# Patient Record
Sex: Female | Born: 1950
Health system: Southern US, Community
[De-identification: ages and names within clinical notes are randomized; demographics above are authoritative.]

## PROBLEM LIST (undated history)

## (undated) DIAGNOSIS — M25562 Pain in left knee: Secondary | ICD-10-CM

## (undated) DIAGNOSIS — E785 Hyperlipidemia, unspecified: Secondary | ICD-10-CM

## (undated) DIAGNOSIS — Z9889 Other specified postprocedural states: Secondary | ICD-10-CM

## (undated) DIAGNOSIS — T8859XA Other complications of anesthesia, initial encounter: Secondary | ICD-10-CM

## (undated) DIAGNOSIS — M1990 Unspecified osteoarthritis, unspecified site: Secondary | ICD-10-CM

## (undated) DIAGNOSIS — I10 Essential (primary) hypertension: Secondary | ICD-10-CM

## (undated) DIAGNOSIS — T4145XA Adverse effect of unspecified anesthetic, initial encounter: Secondary | ICD-10-CM

## (undated) DIAGNOSIS — R112 Nausea with vomiting, unspecified: Secondary | ICD-10-CM

## (undated) DIAGNOSIS — E039 Hypothyroidism, unspecified: Secondary | ICD-10-CM

## (undated) DIAGNOSIS — R12 Heartburn: Secondary | ICD-10-CM

## (undated) HISTORY — DX: Essential (primary) hypertension: I10

## (undated) HISTORY — PX: ABDOMINAL HYSTERECTOMY: SHX81

## (undated) HISTORY — PX: ABDOMINAL HYSTERECTOMY: SUR658

## (undated) HISTORY — DX: Heartburn: R12

## (undated) HISTORY — DX: Unspecified osteoarthritis, unspecified site: M19.90

## (undated) HISTORY — PX: OTHER SURGICAL HISTORY: SHX169

## (undated) HISTORY — PX: REPLACEMENT TOTAL KNEE BILATERAL: SUR1225

## (undated) HISTORY — DX: Hyperlipidemia, unspecified: E78.5

## (undated) HISTORY — DX: Hypothyroidism, unspecified: E03.9

## (undated) HISTORY — DX: Pain in left knee: M25.562

---
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
## 1971-07-11 HISTORY — PX: BREAST BIOPSY: SHX20

## 1978-07-10 HISTORY — PX: CHOLECYSTECTOMY: SHX55

## 1999-07-11 DIAGNOSIS — E039 Hypothyroidism, unspecified: Secondary | ICD-10-CM | POA: Insufficient documentation

## 2000-07-10 HISTORY — PX: NASAL SINUS SURGERY: SHX719

## 2000-07-10 HISTORY — PX: THYROID SURGERY: SHX805

## 2005-07-10 HISTORY — PX: ULNAR NERVE TRANSPOSITION: SHX2595

## 2010-07-10 HISTORY — PX: INCONTINENCE SURGERY: SHX676

## 2010-07-10 HISTORY — PX: REPLACEMENT TOTAL KNEE: SUR1224

## 2011-03-11 DIAGNOSIS — Z96652 Presence of left artificial knee joint: Secondary | ICD-10-CM | POA: Insufficient documentation

## 2016-04-26 ENCOUNTER — Other Ambulatory Visit: Payer: Self-pay | Admitting: Family Medicine

## 2016-04-26 DIAGNOSIS — Z1231 Encounter for screening mammogram for malignant neoplasm of breast: Secondary | ICD-10-CM

## 2016-05-18 DIAGNOSIS — G8929 Other chronic pain: Secondary | ICD-10-CM | POA: Diagnosis not present

## 2016-05-18 DIAGNOSIS — M25562 Pain in left knee: Secondary | ICD-10-CM | POA: Diagnosis not present

## 2016-05-22 DIAGNOSIS — M25562 Pain in left knee: Secondary | ICD-10-CM | POA: Diagnosis not present

## 2016-05-22 DIAGNOSIS — G8929 Other chronic pain: Secondary | ICD-10-CM | POA: Diagnosis not present

## 2016-05-26 ENCOUNTER — Encounter (HOSPITAL_COMMUNITY): Payer: Self-pay

## 2016-05-26 ENCOUNTER — Ambulatory Visit
Admission: RE | Admit: 2016-05-26 | Discharge: 2016-05-26 | Disposition: A | Discharge status: Home / Self Care | Payer: Medicare Other | Source: Ambulatory Visit | Attending: Family Medicine | Admitting: Family Medicine

## 2016-05-26 DIAGNOSIS — M25562 Pain in left knee: Secondary | ICD-10-CM | POA: Diagnosis not present

## 2016-05-26 DIAGNOSIS — Z1231 Encounter for screening mammogram for malignant neoplasm of breast: Secondary | ICD-10-CM

## 2016-05-26 DIAGNOSIS — G8929 Other chronic pain: Secondary | ICD-10-CM | POA: Diagnosis not present

## 2016-05-29 DIAGNOSIS — R300 Dysuria: Secondary | ICD-10-CM | POA: Diagnosis not present

## 2016-05-30 DIAGNOSIS — G8929 Other chronic pain: Secondary | ICD-10-CM | POA: Diagnosis not present

## 2016-05-30 DIAGNOSIS — M25562 Pain in left knee: Secondary | ICD-10-CM | POA: Diagnosis not present

## 2016-06-05 DIAGNOSIS — G8929 Other chronic pain: Secondary | ICD-10-CM | POA: Diagnosis not present

## 2016-06-05 DIAGNOSIS — M25562 Pain in left knee: Secondary | ICD-10-CM | POA: Diagnosis not present

## 2016-06-06 ENCOUNTER — Other Ambulatory Visit: Payer: Self-pay | Admitting: *Deleted

## 2016-06-06 ENCOUNTER — Inpatient Hospital Stay
Admission: RE | Admit: 2016-06-06 | Discharge: 2016-06-06 | Disposition: A | Discharge status: Home / Self Care | Payer: Self-pay | Source: Ambulatory Visit | Attending: *Deleted | Admitting: *Deleted

## 2016-06-06 DIAGNOSIS — Z9289 Personal history of other medical treatment: Secondary | ICD-10-CM

## 2016-06-08 DIAGNOSIS — M25562 Pain in left knee: Secondary | ICD-10-CM | POA: Diagnosis not present

## 2016-06-08 DIAGNOSIS — G8929 Other chronic pain: Secondary | ICD-10-CM | POA: Diagnosis not present

## 2016-06-09 HISTORY — PX: OTHER SURGICAL HISTORY: SHX169

## 2016-06-13 DIAGNOSIS — G8929 Other chronic pain: Secondary | ICD-10-CM | POA: Diagnosis not present

## 2016-06-13 DIAGNOSIS — M25562 Pain in left knee: Secondary | ICD-10-CM | POA: Diagnosis not present

## 2016-06-15 ENCOUNTER — Ambulatory Visit (INDEPENDENT_AMBULATORY_CARE_PROVIDER_SITE_OTHER): Payer: Medicare Other | Admitting: Vascular Surgery

## 2016-06-15 ENCOUNTER — Encounter (INDEPENDENT_AMBULATORY_CARE_PROVIDER_SITE_OTHER): Payer: Self-pay | Admitting: Vascular Surgery

## 2016-06-15 DIAGNOSIS — I83813 Varicose veins of bilateral lower extremities with pain: Secondary | ICD-10-CM

## 2016-06-15 DIAGNOSIS — M79604 Pain in right leg: Secondary | ICD-10-CM

## 2016-06-15 DIAGNOSIS — M79605 Pain in left leg: Secondary | ICD-10-CM

## 2016-06-15 DIAGNOSIS — I872 Venous insufficiency (chronic) (peripheral): Secondary | ICD-10-CM | POA: Insufficient documentation

## 2016-06-15 DIAGNOSIS — I83811 Varicose veins of right lower extremities with pain: Secondary | ICD-10-CM

## 2016-06-15 DIAGNOSIS — M79609 Pain in unspecified limb: Secondary | ICD-10-CM | POA: Insufficient documentation

## 2016-06-15 NOTE — Progress Notes (Signed)
    MRN : [id number]  Holly Lam is a 65 y.o. (02/18/1951) female who presents with chief complaint of  Chief Complaint  Patient presents with  . Varicose Veins    Right leg Laser ablation Great and small saph  .    The patient's right lower extremity was sterilely prepped and draped.  The ultrasound machine was used to visualize the great saphenous vein throughout its course.  A segment below the knee was selected for access.  The saphenous vein was accessed without difficulty using ultrasound guidance with a micropuncture needle.   An 0.018  wire was placed beyond the saphenofemoral junction through the sheath and the microneedle was removed.  The 65 cm sheath was then placed over the wire and the wire and dilator were removed.  The laser fiber was placed through the sheath and its tip was placed approximately 2 cm below the saphenofemoral junction.  Tumescent anesthesia was then created with a dilute lidocaine solution.  Laser energy was then delivered with constant withdrawal of the sheath and laser fiber.  Approximately 1952 Joules of energy were delivered over a length of 49 cm.    The small saphenus ven was then evaluated with the ultrasound and found to be approximately 1 mm in diameter.  There is a large 8-10 mm gastroc perforator that feeds into the posterior varicosities identified by ultrasound.  This would best be treated with ultrasound guided sclerotherapy.  Sterile dressings were placed.  The patient tolerated the procedure well without complications.

## 2016-06-19 ENCOUNTER — Other Ambulatory Visit (INDEPENDENT_AMBULATORY_CARE_PROVIDER_SITE_OTHER): Payer: Self-pay | Admitting: Vascular Surgery

## 2016-06-19 ENCOUNTER — Ambulatory Visit (INDEPENDENT_AMBULATORY_CARE_PROVIDER_SITE_OTHER): Payer: Medicare Other

## 2016-06-19 DIAGNOSIS — I83813 Varicose veins of bilateral lower extremities with pain: Secondary | ICD-10-CM | POA: Diagnosis not present

## 2016-06-20 DIAGNOSIS — E890 Postprocedural hypothyroidism: Secondary | ICD-10-CM | POA: Diagnosis not present

## 2016-06-22 DIAGNOSIS — M25562 Pain in left knee: Secondary | ICD-10-CM | POA: Diagnosis not present

## 2016-06-22 DIAGNOSIS — G8929 Other chronic pain: Secondary | ICD-10-CM | POA: Diagnosis not present

## 2016-06-26 DIAGNOSIS — G8929 Other chronic pain: Secondary | ICD-10-CM | POA: Diagnosis not present

## 2016-06-26 DIAGNOSIS — M25562 Pain in left knee: Secondary | ICD-10-CM | POA: Diagnosis not present

## 2016-06-27 DIAGNOSIS — E890 Postprocedural hypothyroidism: Secondary | ICD-10-CM | POA: Diagnosis not present

## 2016-06-27 DIAGNOSIS — E041 Nontoxic single thyroid nodule: Secondary | ICD-10-CM | POA: Diagnosis not present

## 2016-06-27 DIAGNOSIS — E6609 Other obesity due to excess calories: Secondary | ICD-10-CM | POA: Diagnosis not present

## 2016-06-28 DIAGNOSIS — R300 Dysuria: Secondary | ICD-10-CM | POA: Diagnosis not present

## 2016-07-05 DIAGNOSIS — G8929 Other chronic pain: Secondary | ICD-10-CM | POA: Diagnosis not present

## 2016-07-05 DIAGNOSIS — M25562 Pain in left knee: Secondary | ICD-10-CM | POA: Diagnosis not present

## 2016-07-12 DIAGNOSIS — M25562 Pain in left knee: Secondary | ICD-10-CM | POA: Diagnosis not present

## 2016-07-12 DIAGNOSIS — G8929 Other chronic pain: Secondary | ICD-10-CM | POA: Diagnosis not present

## 2016-07-13 ENCOUNTER — Encounter (INDEPENDENT_AMBULATORY_CARE_PROVIDER_SITE_OTHER): Payer: Self-pay | Admitting: Vascular Surgery

## 2016-07-13 ENCOUNTER — Ambulatory Visit (INDEPENDENT_AMBULATORY_CARE_PROVIDER_SITE_OTHER): Payer: Medicare Other | Admitting: Vascular Surgery

## 2016-07-13 VITALS — BP 123/73 | HR 68 | Resp 16 | Ht 65.0 in | Wt 236.0 lb

## 2016-07-13 DIAGNOSIS — M79605 Pain in left leg: Secondary | ICD-10-CM | POA: Diagnosis not present

## 2016-07-13 DIAGNOSIS — M79604 Pain in right leg: Secondary | ICD-10-CM

## 2016-07-13 DIAGNOSIS — I872 Venous insufficiency (chronic) (peripheral): Secondary | ICD-10-CM | POA: Diagnosis not present

## 2016-07-13 DIAGNOSIS — I83813 Varicose veins of bilateral lower extremities with pain: Secondary | ICD-10-CM | POA: Diagnosis not present

## 2016-07-13 NOTE — Progress Notes (Signed)
MRN : [id number]  Holly Lam is a 66 y.o. (09/22/1950) female who presents with chief complaint of  Chief Complaint  Patient presents with  . Routine Post Op    Post Laser Right Leg  .  History of Present Illness: The patient returns to the office for followup status post laser ablation of the right saphenous vein on 06/15/2016.  Follow-up ultrasound dated 06/19/2016 demonstrates successful ablation of the right great saphenous vein.The patient notes significant improvement in the lower extremity pain but not resolution of the symptoms. The patient notes multiple residual varicosities bilaterally which continued to hurt with dependent positions and remained tender to palpation. The patient's swelling is minimally from preoperative status. The patient continues to wear graduated compression stockings on a daily basis but these are not eliminating the pain and discomfort. The patient continues to use over-the-counter anti-inflammatory medications to treat the pain and related symptoms but this has not given the patient relief. The patient notes the pain in the lower extremities is causing problems with daily exercise, problems at work and even with household activities such as preparing meals and doing dishes.  The patient is otherwise done well and there have been no complications related to the laser procedure or interval changes in the patient's overall   Post laser ultrasound shows successful ablation of the right great saphenous vein  Current Meds  Medication Sig  . acetaminophen (TYLENOL) 500 MG tablet Take by mouth.  . ALPRAZolam (XANAX) 0.5 MG tablet   . celecoxib (CELEBREX) 200 MG capsule   . cetirizine (ZYRTEC) 10 MG tablet Take by mouth.  . docusate sodium (COLACE) 100 MG capsule Take by mouth.  . fexofenadine (ALLEGRA) 180 MG tablet Take by mouth.  . lansoprazole (PREVACID) 30 MG capsule   . levothyroxine (SYNTHROID, LEVOTHROID) 50 MCG tablet Take by mouth.  . olmesartan  (BENICAR) 40 MG tablet Take by mouth.  . olmesartan (BENICAR) 40 MG tablet   . sulfamethoxazole-trimethoprim (BACTRIM DS,SEPTRA DS) 800-160 MG tablet   . SYNTHROID 50 MCG tablet   . venlafaxine XR (EFFEXOR-XR) 150 MG 24 hr capsule Take by mouth.  Marland Kitchen. VICTOZA 18 MG/3ML SOPN     Past Medical History:  Diagnosis Date  . Diabetes mellitus without complication (HCC)   . Hyperlipidemia   . Hypertension     Past Surgical History:  Procedure Laterality Date  . BREAST BIOPSY Left    neg    Social History Social History  Substance Use Topics  . Smoking status: Never Smoker  . Smokeless tobacco: Never Used  . Alcohol use No    Family History Family History  Problem Relation Age of Onset  . Breast cancer Neg Hx   No family history of bleeding/clotting disorders, porphyria or autoimmune disease   Allergies  Allergen Reactions  . Levofloxacin Nausea Only  . Moxifloxacin Nausea Only  . Prednisone     Other reaction(s): Other (See Comments) Sensitivity     REVIEW OF SYSTEMS (Negative unless checked)  Constitutional: [] Weight loss  [] Fever  [] Chills Cardiac: [] Chest pain   [] Chest pressure   [] Palpitations   [] Shortness of breath when laying flat   [] Shortness of breath with exertion. Vascular:  [] Pain in legs with walking   [x] Pain in legs with standing  [] History of DVT   [] Phlebitis   [x] Swelling in legs   [x] Varicose veins   [] Non-healing ulcers Pulmonary:   [] Uses home oxygen   [] Productive cough   [] Hemoptysis   [] Wheeze  [] COPD   []   Asthma Neurologic:  [] Dizziness   [] Seizures   [] History of stroke   [] History of TIA  [] Aphasia   [] Vissual changes   [] Weakness or numbness in arm   [] Weakness or numbness in leg Musculoskeletal:   [] Joint swelling   [] Joint pain   [] Low back pain Hematologic:  [] Easy bruising  [] Easy bleeding   [] Hypercoagulable state   [] Anemic Gastrointestinal:  [] Diarrhea   [] Vomiting  [] Gastroesophageal reflux/heartburn   [] Difficulty  swallowing. Genitourinary:  [] Chronic kidney disease   [] Difficult urination  [] Frequent urination   [] Blood in urine Skin:  [] Rashes   [] Ulcers  Psychological:  [] History of anxiety   []  History of major depression.  Physical Examination  Vitals:   07/13/16 1413  BP: 123/73  Pulse: 68  Resp: 16  Weight: 107 kg (236 lb)  Height: 5\' 5"  (1.651 m)   Body mass index is 39.27 kg/m. Gen: WD/WN, NAD Head: [place]/AT, No temporalis wasting.  Ear/Nose/Throat: Hearing grossly intact, nares w/o erythema or drainage, poor dentition Eyes: PER, EOMI, sclera nonicteric.  Neck: Supple, no masses.  No bruit or JVD.  Pulmonary:  Good air movement, clear to auscultation bilaterally, no use of accessory muscles.  Cardiac: RRR, normal S1, S2, no Murmurs. Vascular: multiple large residual varicose veins right lower extremity particularly in the medial and posterior calf many are greater than 8 mm in diameter and tender to palpation. There is mild venous dermatitis noted bilaterally in the ankle areas. There is 2+ edema. Vessel Right Left  Radial Palpable Palpable  Ulnar Palpable Palpable  Brachial Palpable Palpable  Carotid Palpable Palpable  Femoral Palpable Palpable  Popliteal Palpable Palpable  PT Palpable Palpable  DP Palpable Palpable   Gastrointestinal: soft, non-distended. No guarding/no peritoneal signs.  Musculoskeletal: M/S 5/5 throughout.  No deformity or atrophy.  Neurologic: CN 2-12 intact. Pain and light touch intact in extremities.  Symmetrical.  Speech is fluent. Motor exam as listed above. Psychiatric: Judgment intact, Mood & affect appropriate for pt's clinical situation. Dermatologic: No rashes or ulcers noted.  No changes consistent with cellulitis. Lymph : No Cervical lymphadenopathy, no lichenification or skin changes of chronic lymphedema.  CBC No results found for: WBC, HGB, HCT, MCV, PLT  BMET No results found for: NA, K, CL, CO2, GLUCOSE, BUN, CREATININE, CALCIUM,  GFRNONAA, GFRAA CrCl cannot be calculated (No order found.).  COAG No results found for: INR, PROTIME  Radiology No results found.  Assessment/Plan 1. Varicose veins of both lower extremities with pain Recommend:  The patient has had successful ablation of the previously incompetent saphenous venous system but still has persistent symptoms of pain and swelling that are having a negative impact on daily life and daily activities.  Patient should undergo injection sclerotherapy of the right lower extremity to treat the symptomatic residual varicosities.  The risks, benefits and alternative therapies were reviewed in detail with the patient.  All questions were answered.  The patient agrees to proceed with sclerotherapy at their convenience.  The patient will continue wearing the graduated compression stockings and using the over-the-counter pain medications to treat her symptoms.    2. Chronic venous insufficiency See plan #1; continue graduated compression on a daily basis  3. Pain in both lower extremities See plan #1   Levora Dredge, MD  07/13/2016 3:23 PM

## 2016-07-20 DIAGNOSIS — M25562 Pain in left knee: Secondary | ICD-10-CM | POA: Diagnosis not present

## 2016-07-20 DIAGNOSIS — G8929 Other chronic pain: Secondary | ICD-10-CM | POA: Diagnosis not present

## 2016-07-28 DIAGNOSIS — R300 Dysuria: Secondary | ICD-10-CM | POA: Diagnosis not present

## 2016-08-03 ENCOUNTER — Ambulatory Visit (INDEPENDENT_AMBULATORY_CARE_PROVIDER_SITE_OTHER): Payer: Medicare Other | Admitting: Vascular Surgery

## 2016-08-03 ENCOUNTER — Encounter (INDEPENDENT_AMBULATORY_CARE_PROVIDER_SITE_OTHER): Payer: Self-pay | Admitting: Vascular Surgery

## 2016-08-03 VITALS — BP 134/78 | HR 69 | Resp 16 | Ht 65.0 in | Wt 238.0 lb

## 2016-08-03 DIAGNOSIS — I872 Venous insufficiency (chronic) (peripheral): Secondary | ICD-10-CM

## 2016-08-03 DIAGNOSIS — I83811 Varicose veins of right lower extremities with pain: Secondary | ICD-10-CM

## 2016-08-03 DIAGNOSIS — I83813 Varicose veins of bilateral lower extremities with pain: Secondary | ICD-10-CM

## 2016-08-06 NOTE — Progress Notes (Signed)
    MRN : [id number]  Holly Lam is a 66 y.o. (08/12/1950) female who presents with chief complaint of  Chief Complaint  Patient presents with  . Varicose Veins    Right leg sclerotherapy  .   Procedure:  Sclerotherapy using hypertonic saline mixed with 1% Lidocaine was performed on lower extremities bilateral.  Compression wraps were placed.  The patient tolerated the procedure well.  Plan:  Follow up as arranged

## 2016-08-15 DIAGNOSIS — R300 Dysuria: Secondary | ICD-10-CM | POA: Diagnosis not present

## 2016-08-23 NOTE — Progress Notes (Signed)
08/24/2016 9:14 AM   Holly Lam 1951-07-08 [id number]  Referring provider: Maryland Pink, MD [address] [facility] [place], [place] [address]  Chief Complaint  Patient presents with  . New Patient (Initial Visit)    recurrent uti referred by Dr. Kary Kos    HPI: Patient is a 66 -year-old Caucasian female who is referred to [place] by, Dr Kary Kos, for recurrent urinary tract infections.  Patient states that she has had four urinary tract infections over the last year.  Her symptoms with a urinary tract infection consist of unusual odor, dysuria, urgency, "feels all puckered up", incontinence, nocturia and frequency.   She denies gross hematuria, suprapubic pain, back pain, abdominal pain or flank pain.  She has not had any recent fevers, chills, nausea or vomiting.   She had a cystocele and rectocele repair with a bladder sling in 2012 was performed in [place] by Dr. Lauretta Grill.    Reviewing her records,  she has had two documented UTI's for E.coli.   She had one instance of AMH with 0-3 RBC's with a negative culture in 03/2016.    She is sexually active.  It is painful to have sex.  She states that her vagina feels raw during sex.  She has not noted a correlation with her urinary tract infections and sexual intercourse.    She is post menopausal.   She does not engage in anal sex.   She is voiding before and after sex.  She admits to constipation.    She does engage in good perineal hygiene. She does not take tub baths.  She does not have incontinence.    She is not having pain with bladder filling.  She has not had any recent imaging studies.    She is not drinking a lot of water daily.   She does drink coffee and tea.     Today, she feels thick and heavy in the pelvis area.  She feels like when she did when she was about to start her periods.  Her UA today was unremarkable.    PMH: Past Medical History:  Diagnosis Date  . Arthritis   . Diabetes mellitus without  complication ([place])   . Heartburn   . Hyperlipidemia   . Hypertension   . Hypothyroidism     Surgical History: Past Surgical History:  Procedure Laterality Date  . ABDOMINAL HYSTERECTOMY    . BREAST BIOPSY Left    neg  . CHOLECYSTECTOMY  1980  . INCONTINENCE SURGERY  2012   also cystocele and rectocele  . leg vein surgery Right 06/2016  . NASAL SINUS SURGERY  2002  . REPLACEMENT TOTAL KNEE Left 2012  . THYROID SURGERY  2002  . ULNAR NERVE TRANSPOSITION Left 2007    Home Medications:  Allergies as of 08/24/2016      Reactions   Levofloxacin Nausea Only   Moxifloxacin Nausea Only   Prednisone    Other reaction(s): Other (See Comments) Sensitivity      Medication List       Accurate as of 08/24/16  9:14 AM. Always use your most recent med list.          acetaminophen 500 MG tablet Commonly known as:  TYLENOL Take by mouth.   ALPRAZolam 0.5 MG tablet Commonly known as:  XANAX   celecoxib 200 MG capsule Commonly known as:  CELEBREX   cetirizine 10 MG tablet Commonly known as:  ZYRTEC Take by mouth.   docusate  sodium 100 MG capsule Commonly known as:  COLACE Take by mouth.   fexofenadine 180 MG tablet Commonly known as:  ALLEGRA Take by mouth.   lansoprazole 30 MG capsule Commonly known as:  PREVACID   levothyroxine 50 MCG tablet Commonly known as:  SYNTHROID, LEVOTHROID Take by mouth.   SYNTHROID 50 MCG tablet Generic drug:  levothyroxine   olmesartan 40 MG tablet Commonly known as:  BENICAR Take by mouth.   olmesartan 40 MG tablet Commonly known as:  BENICAR   sulfamethoxazole-trimethoprim 800-160 MG tablet Commonly known as:  BACTRIM DS,SEPTRA DS   venlafaxine XR 150 MG 24 hr capsule Commonly known as:  EFFEXOR-XR Take by mouth.   VICTOZA 18 MG/3ML Sopn Generic drug:  liraglutide       Allergies:  Allergies  Allergen Reactions  . Levofloxacin Nausea Only  . Moxifloxacin Nausea Only  . Prednisone     Other reaction(s): Other  (See Comments) Sensitivity    Family History: Family History  Problem Relation Age of Onset  . Prostate cancer Brother   . Breast cancer Neg Hx   . Kidney cancer Neg Hx   . Bladder Cancer Neg Hx     Social History:  reports that she has never smoked. She has never used smokeless tobacco. She reports that she drinks alcohol. She reports that she does not use drugs.  ROS: UROLOGY Frequent Urination?: Yes Hard to postpone urination?: Yes Burning/pain with urination?: Yes Get up at night to urinate?: Yes Leakage of urine?: Yes Urine stream starts and stops?: No Trouble starting stream?: No Do you have to strain to urinate?: No Blood in urine?: Yes Urinary tract infection?: Yes Sexually transmitted disease?: No Injury to kidneys or bladder?: No Painful intercourse?: Yes Weak stream?: No Currently pregnant?: No Vaginal bleeding?: No Last menstrual period?: n  Gastrointestinal Nausea?: No Vomiting?: No Indigestion/heartburn?: Yes Diarrhea?: No Constipation?: Yes  Constitutional Fever: No Night sweats?: No Weight loss?: No Fatigue?: Yes  Skin Skin rash/lesions?: No Itching?: No  Eyes Blurred vision?: No Double vision?: No  Ears/Nose/Throat Sore throat?: No Sinus problems?: No  Hematologic/Lymphatic Swollen glands?: No Easy bruising?: No  Cardiovascular Leg swelling?: No Chest pain?: No  Respiratory Cough?: No Shortness of breath?: No  Endocrine Excessive thirst?: No  Musculoskeletal Back pain?: Yes Joint pain?: Yes  Neurological Headaches?: No Dizziness?: No  Psychologic Depression?: No Anxiety?: No  Physical Exam: BP 137/80   Pulse 77   Ht _0  (1.651 m)   Wt 234 lb 14.4 oz (106.5 kg)   BMI 39.09 kg/m   Constitutional: Well nourished. Alert and oriented, No acute distress. HEENT: [place] AT, moist mucus membranes. Trachea midline, no masses. Cardiovascular: No clubbing, cyanosis, or edema. Respiratory: Normal respiratory effort,  no increased work of breathing. GI: Abdomen is soft, non tender, non distended, no abdominal masses. Liver and spleen not palpable.  No hernias appreciated.  Stool sample for occult testing is not indicated.   GU: No CVA tenderness.  No bladder fullness or masses.  Atrophic external genitalia, normal pubic hair distribution, no lesions.  Normal urethral meatus, no lesions, no prolapse, no discharge.   No urethral masses, tenderness and/or tenderness. No bladder fullness, tenderness or masses. Pale vagina mucosa, poor estrogen effect, no discharge, no lesions, good pelvic support, no cystocele or rectocele noted.  Cervix, uterus and adnexa are surgically absent.  Anus and perineum are without rashes or lesions.    Skin: No rashes, bruises or suspicious lesions. Lymph: No cervical or  inguinal adenopathy. Neurologic: Grossly intact, no focal deficits, moving all 4 extremities. Psychiatric: Normal mood and affect.  Laboratory Data: Urinalysis Unremarkable.  See EPIC.   Assessment & Plan:    1. Microscopic hematuria  - I explained to the patient that there are a number of causes that can be associated with blood in the urine, such as stones, UTI's, damage to the urinary tract and/or cancer.  - At this time, I felt that the patient warranted further urologic evaluation.   The AUA guidelines state that a CT urogram is the preferred imaging study to evaluate hematuria.  - I explained to the patient that a contrast material will be injected into a vein and that in rare instances, an allergic reaction can result and may even life threatening   The patient denies any allergies to contrast, iodine and/or seafood and is not taking metformin.  - Her reproductive status is postmenopausal  - Following the imaging study,  I've recommended a cystoscopy. I described how this is performed, typically in an office setting with a flexible cystoscope. We described the risks, benefits, and possible side effects, the most  common of which is a minor amount of blood in the urine and/or burning which usually resolves in 24 to 48 hours.    - The patient had the opportunity to ask questions which were answered. Based upon this discussion, the patient is willing to proceed. Therefore, I've ordered: a CT Urogram and cystoscopy.  - The patient will return following all of the above for discussion of the results.   - UA  - Urine culture  - BUN + creatinine    2. Recurrent UTI's  - criteria for recurrent UTI has been met with 2 or more infections in 6 months   - Patient is instructed to increase their water intake until the urine is pale yellow or clear (10 to 12 cups daily)   - probiotics (yogurt, oral pills or vaginal suppositories), take cranberry pills or drink the juice and Vitamin C 1,000 mg daily to acidify the urine should be added to their daily regimen   - advised them to have CATH UA's for urinalysis and culture to prevent skin contamination of the specimen  - reviewed symptoms of UTI and advised not to have urine checked or be treated for UTI if not experiencing symptoms  - discussed antibiotic stewardship with the patient    3. Vaginal atrophy  - I explained to the patient that when women go through menopause and her estrogen levels are severely diminished, the normal vaginal flora will change.  This is due to an increase of the vaginal canal's pH. Because of this, the vaginal canal may be colonized by bacteria from the rectum instead of the protective lactobacillus.  This accompanied by the loss of the mucus barrier with vaginal atrophy is a cause of recurrent urinary tract infections.  - In some studies, the use of vaginal estrogen cream has been demonstrated to reduce  recurrent urinary tract infections to one a year.   - Patient was given a sample of vaginal estrogen cream (Premarin/Estrace) and instructed to apply 0.73m (pea-sized amount)  just inside the vaginal introitus with a finger-tip every night for  two weeks and then Monday, Wednesday and Friday nights.  I explained to the patient that vaginally administered estrogen, which causes only a slight increase in the blood estrogen levels, have fewer contraindications and adverse systemic effects that oral HT.  - If she finds medication cost  prohibitive, she is instructed to call the office.  We can then call in a compounded vaginal estrogen cream for the patient that may be more affordable.    - She will follow up in three months for an exam.                                                                            Return for CT Urogram report and cystoscopy.  These notes generated with voice recognition software. I apologize for typographical errors.  Zara Council, [facility] [address], [place], [place] [address] [phone]

## 2016-08-24 ENCOUNTER — Ambulatory Visit (INDEPENDENT_AMBULATORY_CARE_PROVIDER_SITE_OTHER): Payer: Medicare Other | Admitting: Urology

## 2016-08-24 ENCOUNTER — Encounter: Payer: Self-pay | Admitting: Urology

## 2016-08-24 VITALS — BP 137/80 | HR 77 | Ht 65.0 in | Wt 234.9 lb

## 2016-08-24 DIAGNOSIS — N952 Postmenopausal atrophic vaginitis: Secondary | ICD-10-CM

## 2016-08-24 DIAGNOSIS — R3129 Other microscopic hematuria: Secondary | ICD-10-CM

## 2016-08-24 DIAGNOSIS — N390 Urinary tract infection, site not specified: Secondary | ICD-10-CM | POA: Diagnosis not present

## 2016-08-24 LAB — MICROSCOPIC EXAMINATION
Bacteria, UA: NONE SEEN
RBC MICROSCOPIC, UA: NONE SEEN /HPF (ref 0–?)

## 2016-08-24 LAB — URINALYSIS, COMPLETE
Bilirubin, UA: NEGATIVE
Glucose, UA: NEGATIVE
Ketones, UA: NEGATIVE
Leukocytes, UA: NEGATIVE
NITRITE UA: NEGATIVE
Protein, UA: NEGATIVE
RBC, UA: NEGATIVE
Specific Gravity, UA: 1.02 (ref 1.005–1.030)
Urobilinogen, Ur: 0.2 mg/dL (ref 0.2–1.0)
pH, UA: 5 (ref 5.0–7.5)

## 2016-08-24 NOTE — Patient Instructions (Signed)

## 2016-08-25 LAB — BUN+CREAT
BUN/Creatinine Ratio: 33 — ABNORMAL HIGH (ref 12–28)
BUN: 24 mg/dL (ref 8–27)
Creatinine, Ser: 0.73 mg/dL (ref 0.57–1.00)
GFR, EST AFRICAN AMERICAN: 100 mL/min/{1.73_m2} (ref >59)
GFR, EST NON AFRICAN AMERICAN: 87 mL/min/{1.73_m2} (ref >59)

## 2016-08-27 LAB — CULTURE, URINE COMPREHENSIVE

## 2016-08-30 ENCOUNTER — Ambulatory Visit: Payer: Self-pay | Admitting: Urology

## 2016-08-31 ENCOUNTER — Ambulatory Visit (INDEPENDENT_AMBULATORY_CARE_PROVIDER_SITE_OTHER): Payer: Medicare Other | Admitting: Vascular Surgery

## 2016-08-31 ENCOUNTER — Encounter (INDEPENDENT_AMBULATORY_CARE_PROVIDER_SITE_OTHER): Payer: Self-pay | Admitting: Vascular Surgery

## 2016-08-31 VITALS — BP 114/75 | HR 69 | Resp 16 | Ht 65.0 in | Wt 235.0 lb

## 2016-08-31 DIAGNOSIS — I83813 Varicose veins of bilateral lower extremities with pain: Secondary | ICD-10-CM | POA: Diagnosis not present

## 2016-08-31 DIAGNOSIS — I83811 Varicose veins of right lower extremities with pain: Secondary | ICD-10-CM | POA: Diagnosis not present

## 2016-08-31 DIAGNOSIS — I872 Venous insufficiency (chronic) (peripheral): Secondary | ICD-10-CM

## 2016-09-03 NOTE — Progress Notes (Signed)
    MRN : [id number]  Wilmer FloorRobin Chohan is a 66 y.o. (01/15/1951) female who presents with chief complaint of  Chief Complaint  Patient presents with  . Varicose Veins    Right leg sclerotherapy  .   Procedure:  Sclerotherapy using hypertonic saline mixed with 1% Lidocaine was performed on the right  lower extremity.  Compression wraps were placed.  The patient tolerated the procedure well.  Plan:  Follow up as arranged

## 2016-09-06 ENCOUNTER — Ambulatory Visit
Admission: RE | Admit: 2016-09-06 | Discharge: 2016-09-06 | Disposition: A | Discharge status: Home / Self Care | Payer: Medicare Other | Source: Ambulatory Visit | Attending: Urology | Admitting: Urology

## 2016-09-06 DIAGNOSIS — R160 Hepatomegaly, not elsewhere classified: Secondary | ICD-10-CM | POA: Diagnosis not present

## 2016-09-06 DIAGNOSIS — K760 Fatty (change of) liver, not elsewhere classified: Secondary | ICD-10-CM | POA: Diagnosis not present

## 2016-09-06 DIAGNOSIS — N3091 Cystitis, unspecified with hematuria: Secondary | ICD-10-CM | POA: Insufficient documentation

## 2016-09-06 DIAGNOSIS — R310 Gross hematuria: Secondary | ICD-10-CM | POA: Diagnosis not present

## 2016-09-06 DIAGNOSIS — I700 Atherosclerosis of aorta: Secondary | ICD-10-CM | POA: Insufficient documentation

## 2016-09-06 DIAGNOSIS — R3129 Other microscopic hematuria: Secondary | ICD-10-CM | POA: Diagnosis present

## 2016-09-06 MED ORDER — IOPAMIDOL (ISOVUE-300) INJECTION 61%
125.0000 mL | Freq: Once | INTRAVENOUS | Status: AC | PRN
  Administered 2016-09-06: 125 mL via INTRAVENOUS

## 2016-09-14 ENCOUNTER — Ambulatory Visit (INDEPENDENT_AMBULATORY_CARE_PROVIDER_SITE_OTHER): Payer: Medicare Other | Admitting: Urology

## 2016-09-14 VITALS — BP 110/75 | HR 90 | Ht 65.0 in | Wt 233.8 lb

## 2016-09-14 DIAGNOSIS — N390 Urinary tract infection, site not specified: Secondary | ICD-10-CM | POA: Diagnosis not present

## 2016-09-14 DIAGNOSIS — N952 Postmenopausal atrophic vaginitis: Secondary | ICD-10-CM | POA: Diagnosis not present

## 2016-09-14 DIAGNOSIS — R3129 Other microscopic hematuria: Secondary | ICD-10-CM | POA: Diagnosis not present

## 2016-09-14 LAB — URINALYSIS, COMPLETE
Bilirubin, UA: NEGATIVE
Glucose, UA: NEGATIVE
Nitrite, UA: NEGATIVE
PH UA: 5 (ref 5.0–7.5)
Specific Gravity, UA: >1.03 — ABNORMAL HIGH (ref 1.005–1.030)
Urobilinogen, Ur: 0.2 mg/dL (ref 0.2–1.0)

## 2016-09-14 LAB — MICROSCOPIC EXAMINATION: WBC, UA: >30 /hpf — AB (ref 0–?)

## 2016-09-14 MED ORDER — LIDOCAINE HCL 2 % EX GEL: 1.0000 "application " | Freq: Once | CUTANEOUS | Status: DC

## 2016-09-14 MED ORDER — NITROFURANTOIN MONOHYD MACRO 100 MG PO CAPS: 100.0000 mg | ORAL_CAPSULE | Freq: Two times a day (BID) | ORAL | 0 refills | Status: DC

## 2016-09-14 MED ORDER — SULFAMETHOXAZOLE-TRIMETHOPRIM 800-160 MG PO TABS: 1.0000 | ORAL_TABLET | Freq: Once | ORAL | Status: DC

## 2016-09-14 MED ORDER — ESTROGENS, CONJUGATED 0.625 MG/GM VA CREA: TOPICAL_CREAM | VAGINAL | 12 refills | Status: DC

## 2016-09-14 NOTE — Progress Notes (Signed)
09/14/2016 11:12 AM   Holly Lam 05-Dec-1950 [id number]  Referring provider: Jerl Mina, MD [address] [facility] [place], [place] [address]  Chief Complaint  Patient presents with  . Cysto    hematuria     HPI: Patient is a 66 -year-old Caucasian female who presents today for completion of microscopic hematuria work up.  1. Microscopic hematuria She has had microscopic hematuria with a negative culture. Her CT Urogram was negative for source of hematuria. Still needs cystoscopy.  2. Recurrent UTI Patient feels like she has UTI currently. U/A is suspicious for infection. Has new dysuria, urgency, and frequency like previous UTIs.  Patient has had had two documented UTI's for E.coli (pansensitive), however patient states that she has had four urinary tract infections over the last year.  She had a cystocele and rectocele repair with a bladder sling in 2012 was performed in [place] by Dr. Garey Ham.    3. Vaginal atrophy She is sexually active.  It is painful to have sex.  She states that her vagina feels raw during sex.  She has not noted a correlation with her urinary tract infections and sexual intercourse.  She was recently started on vaginal estrogen cream but stopped taking it.   PMH: Past Medical History:  Diagnosis Date  . Arthritis   . Diabetes mellitus without complication (HCC)   . Heartburn   . Hyperlipidemia   . Hypertension   . Hypothyroidism     Surgical History: Past Surgical History:  Procedure Laterality Date  . ABDOMINAL HYSTERECTOMY    . BREAST BIOPSY Left    neg  . CHOLECYSTECTOMY  1980  . INCONTINENCE SURGERY  2012   also cystocele and rectocele  . leg vein surgery Right 06/2016  . NASAL SINUS SURGERY  2002  . REPLACEMENT TOTAL KNEE Left 2012  . THYROID SURGERY  2002  . ULNAR NERVE TRANSPOSITION Left 2007    Home Medications:  Allergies as of 09/14/2016      Reactions   Levofloxacin Nausea Only   Moxifloxacin Nausea Only     Prednisone    Other reaction(s): Other (See Comments) Sensitivity      Medication List       Accurate as of 09/14/16 11:12 AM. Always use your most recent med list.          acetaminophen 500 MG tablet Commonly known as:  TYLENOL Take by mouth.   celecoxib 200 MG capsule Commonly known as:  CELEBREX   cetirizine 10 MG tablet Commonly known as:  ZYRTEC Take by mouth.   conjugated estrogens vaginal cream Commonly known as:  PREMARIN Apply a pea sized amount to vagina on Monday, Wednesday, and Friday evening.   docusate sodium 100 MG capsule Commonly known as:  COLACE Take by mouth.   fexofenadine 180 MG tablet Commonly known as:  ALLEGRA Take by mouth.   levothyroxine 50 MCG tablet Commonly known as:  SYNTHROID, LEVOTHROID Take by mouth.   nitrofurantoin (macrocrystal-monohydrate) 100 MG capsule Commonly known as:  MACROBID Take 1 capsule (100 mg total) by mouth every 12 (twelve) hours.   olmesartan 40 MG tablet Commonly known as:  BENICAR Take by mouth.   venlafaxine XR 150 MG 24 hr capsule Commonly known as:  EFFEXOR-XR Take by mouth.   VICTOZA 18 MG/3ML Sopn Generic drug:  liraglutide       Allergies:  Allergies  Allergen Reactions  . Levofloxacin Nausea Only  . Moxifloxacin Nausea Only  .  Prednisone     Other reaction(s): Other (See Comments) Sensitivity    Family History: Family History  Problem Relation Age of Onset  . Prostate cancer Brother   . Breast cancer Neg Hx   . Kidney cancer Neg Hx   . Bladder Cancer Neg Hx     Social History:  reports that she has never smoked. She has never used smokeless tobacco. She reports that she drinks alcohol. She reports that she does not use drugs.  ROS:                                        Physical Exam: BP 110/75   Pulse 90   Ht 5\' 5"  (1.651 m)   Wt 233 lb 12.8 oz (106.1 kg)   BMI 38.91 kg/m   Constitutional:  Alert and oriented, No acute distress. HEENT: [place]  AT, moist mucus membranes.  Trachea midline, no masses. Cardiovascular: No clubbing, cyanosis, or edema. Respiratory: Normal respiratory effort, no increased work of breathing. GI: Abdomen is soft, nontender, nondistended, no abdominal masses GU: No CVA tenderness.  Skin: No rashes, bruises or suspicious lesions. Lymph: No cervical or inguinal adenopathy. Neurologic: Grossly intact, no focal deficits, moving all 4 extremities. Psychiatric: Normal mood and affect.  Laboratory Data: No results found for: WBC, HGB, HCT, MCV, PLT  Lab Results  Component Value Date   CREATININE 0.73 08/24/2016    No results found for: PSA  No results found for: TESTOSTERONE  No results found for: HGBA1C  Urinalysis    Component Value Date/Time   APPEARANCEUR Clear 08/24/2016 0824   GLUCOSEU Negative 08/24/2016 0824   BILIRUBINUR Negative 08/24/2016 0824   PROTEINUR Negative 08/24/2016 0824   NITRITE Negative 08/24/2016 0824   LEUKOCYTESUR Negative 08/24/2016 0824    Pertinent Imaging: CT Urogram negative for source of hematuria  Assessment & Plan:    1. Microscopic hematuria -discuss CT findings with patient which showed no source for hematuria -will need cystoscopy once symptoms resolve. Follow up in 2-3 weeks  2. Recurrent UTI -Only two documented UTIs. -will check cathed urine culture today due to patient's symptoms and suspicious U/A -will start macrobid pending above -vaginal estrogen cream for her vaginal atrophy should help decrease occurences  3. Vaginal atrophy -resume vaginal estrogen cream -follow up with Michiel CowboyShannon McGowan, PA for repeat vaginal exam in three months   Return in about 3 weeks (around 10/05/2016) for cysto.  Hildred LaserBrian James Voyd Groft, MD  [facility] [address], [address] [place], [place] [address] [phone]

## 2016-09-17 LAB — CULTURE, URINE COMPREHENSIVE

## 2016-10-03 DIAGNOSIS — E041 Nontoxic single thyroid nodule: Secondary | ICD-10-CM | POA: Diagnosis not present

## 2016-10-05 ENCOUNTER — Ambulatory Visit (INDEPENDENT_AMBULATORY_CARE_PROVIDER_SITE_OTHER): Payer: Medicare Other | Admitting: Vascular Surgery

## 2016-10-05 ENCOUNTER — Ambulatory Visit (INDEPENDENT_AMBULATORY_CARE_PROVIDER_SITE_OTHER): Payer: Medicare Other | Admitting: Urology

## 2016-10-05 VITALS — BP 107/68 | HR 94 | Ht 65.0 in | Wt 232.5 lb

## 2016-10-05 DIAGNOSIS — R3129 Other microscopic hematuria: Secondary | ICD-10-CM

## 2016-10-05 DIAGNOSIS — N952 Postmenopausal atrophic vaginitis: Secondary | ICD-10-CM

## 2016-10-05 LAB — MICROSCOPIC EXAMINATION: RBC, UA: NONE SEEN /hpf (ref 0–?)

## 2016-10-05 LAB — URINALYSIS, COMPLETE
BILIRUBIN UA: NEGATIVE
GLUCOSE, UA: NEGATIVE
Nitrite, UA: NEGATIVE
Protein, UA: NEGATIVE
RBC UA: NEGATIVE
SPEC GRAV UA: 1.025 (ref 1.005–1.030)
Urobilinogen, Ur: 0.2 mg/dL (ref 0.2–1.0)
pH, UA: 5 (ref 5.0–7.5)

## 2016-10-05 MED ORDER — LIDOCAINE HCL 2 % EX GEL
1.0000 "application " | Freq: Once | CUTANEOUS | Status: AC
  Administered 2016-10-05: 1 via TOPICAL

## 2016-10-05 MED ORDER — SULFAMETHOXAZOLE-TRIMETHOPRIM 800-160 MG PO TABS
1.0000 | ORAL_TABLET | Freq: Once | ORAL | Status: AC
  Administered 2016-10-05: 1 via ORAL

## 2016-10-05 NOTE — Progress Notes (Signed)
   10/05/16  CC: No chief complaint on file.   HPI: Patient is a 66 year old Caucasian female who presents today for completion of microscopic hematuria work up.  1. Microscopic hematuria She has had microscopic hematuria with a negative culture. Her CT Urogram was negative for source of hematuria. Still needs cystoscopy.  2. Recurrent UTI Patient has had had two documented UTI's for E.coli (pansensitive), however patient states that she has had foururinary tract infections over the last year. At her last visit, she had E. Coli in her urine however it only grew 4K colonies.  She had a cystocele and rectocele repair with a bladder sling in 2012 was performed in [place] by Dr. Garey HamLance Weist.   3. Vaginal atrophy She is sexually active. It is painful to have sex. She states that her vagina feels raw during sex. She has not noted a correlation with her urinary tract infections and sexual intercourse. She was recently started on vaginal estrogen cream but stopped taking it.   There were no vitals taken for this visit. NED. A&Ox3.   No respiratory distress   Abd soft, NT, ND Normal external genitalia with patent urethral meatus  Cystoscopy Procedure Note  Patient identification was confirmed, informed consent was obtained, and patient was prepped using Betadine solution.  Lidocaine jelly was administered per urethral meatus.    Preoperative abx where received prior to procedure.    Procedure: - Flexible cystoscope introduced, without any difficulty.   - Thorough search of the bladder revealed:    normal urethral meatus    normal urothelium    no stones    no ulcers     no tumors    no urethral polyps    no trabeculation  - Ureteral orifices were normal in position and appearance.  Post-Procedure: - Patient tolerated the procedure well  Assessment/ Plan:  1. Microscopic hematuria -negative work up. Will need repeat urinalysis in on year.  2. Recurrent UTI -Only two  documented UTIs -vaginal estrogen cream for her vaginal atrophy should help decrease occurences  3. Vaginal atrophy -resume vaginal estrogen cream -follow up with Michiel CowboyShannon McGowan, PA for repeat vaginal exam in three months  Hildred LaserBrian James Arnesia Vincelette, MD

## 2016-11-02 ENCOUNTER — Encounter (INDEPENDENT_AMBULATORY_CARE_PROVIDER_SITE_OTHER): Payer: Self-pay | Admitting: Vascular Surgery

## 2016-11-02 ENCOUNTER — Ambulatory Visit (INDEPENDENT_AMBULATORY_CARE_PROVIDER_SITE_OTHER): Payer: Medicare Other | Admitting: Vascular Surgery

## 2016-11-02 VITALS — BP 122/73 | HR 70 | Resp 16 | Ht 67.0 in | Wt 235.0 lb

## 2016-11-02 DIAGNOSIS — I83813 Varicose veins of bilateral lower extremities with pain: Secondary | ICD-10-CM

## 2016-11-02 DIAGNOSIS — I872 Venous insufficiency (chronic) (peripheral): Secondary | ICD-10-CM

## 2016-11-02 DIAGNOSIS — I83811 Varicose veins of right lower extremities with pain: Secondary | ICD-10-CM

## 2016-11-02 NOTE — Progress Notes (Signed)
    MRN : [id number]  Holly Lam is a 67 y.o. (1951-06-07) female who presents with chief complaint of  Chief Complaint  Patient presents with  . Varicose Veins    Right leg sclero  .   Procedure:  Sclerotherapy using hypertonic saline mixed with 1% Lidocaine was performed on lower extremities bilateral.  Compression wraps were placed.  The patient tolerated the procedure well.  Plan:  Follow up as arranged

## 2016-12-26 DIAGNOSIS — E079 Disorder of thyroid, unspecified: Secondary | ICD-10-CM | POA: Diagnosis not present

## 2017-01-02 DIAGNOSIS — E041 Nontoxic single thyroid nodule: Secondary | ICD-10-CM | POA: Diagnosis not present

## 2017-01-02 DIAGNOSIS — E890 Postprocedural hypothyroidism: Secondary | ICD-10-CM | POA: Diagnosis not present

## 2017-01-02 DIAGNOSIS — E669 Obesity, unspecified: Secondary | ICD-10-CM | POA: Diagnosis not present

## 2017-01-04 ENCOUNTER — Ambulatory Visit: Payer: Medicare Other | Admitting: Urology

## 2017-01-22 NOTE — Progress Notes (Signed)
01/23/2017 3:21 PM   Holly Lam 03-21-1951 [id number]  Referring provider: Jerl Mina, MD [address] [facility] [place], [place] [address]  Chief Complaint  Patient presents with  . Vaginal Atrophy    3 month follow up     HPI: 66 yo WF who presents today for a 3 months follow up.  Background history Patient is a 35 -year-old Caucasian female who is referred to [place] by, Dr Burnett Sheng, for recurrent urinary tract infections.  Patient states that she has had four urinary tract infections over the last year.  Her symptoms with a urinary tract infection consist of unusual odor, dysuria, urgency, "feels all puckered up", incontinence, nocturia and frequency.   She denies gross hematuria, suprapubic pain, back pain, abdominal pain or flank pain.  She has not had any recent fevers, chills, nausea or vomiting.  She had a cystocele and rectocele repair with a bladder sling in 2012 was performed in [place] by Dr. Garey Ham.   Reviewing her records,  she has had two documented UTI's for E.coli.   She had one instance of AMH with 0-3 RBC's with a negative culture in 03/2016.  She is sexually active.  It is painful to have sex.  She states that her vagina feels raw during sex.  She has not noted a correlation with her urinary tract infections and sexual intercourse.  She is post menopausal.  She does not engage in anal sex.   She is voiding before and after sex.  She admits to constipation.  She does engage in good perineal hygiene. She does not take tub baths.  She does not have incontinence.  She is not having pain with bladder filling.  She has not had any recent imaging studies.  She is not drinking a lot of water daily.   She does drink coffee and tea.   She feels thick and heavy in the pelvis area.  She feels like when she did when she was about to start her periods.  Her UA today was unremarkable.    She completed her hematuria work up 09/2016 and no malignancies were found.  She did  experience urinary tract infection and had a positive culture for Escherichia coli.  Today, she is having an occasional episode of nocturia and continues to have painful intercourse.  She states that she is using lubricant and the vaginal estrogen cream, but she has not seen any improvement in the pain.  She describes the pain as her husband not being able to insert fully.  She is very concerned on how this will effect their relationship long term.    PMH: Past Medical History:  Diagnosis Date  . Arthritis   . Diabetes mellitus without complication (HCC)   . Heartburn   . Hyperlipidemia   . Hypertension   . Hypothyroidism     Surgical History: Past Surgical History:  Procedure Laterality Date  . ABDOMINAL HYSTERECTOMY    . CHOLECYSTECTOMY  1980  . INCONTINENCE SURGERY  2012   also cystocele and rectocele  . leg vein surgery Right 06/2016  . NASAL SINUS SURGERY  2002  . REPLACEMENT TOTAL KNEE Left 2012  . THYROID SURGERY  2002  . ULNAR NERVE TRANSPOSITION Left 2007    Home Medications:  Allergies as of 01/23/2017      Reactions   Tape Rash   Levofloxacin Nausea Only   Moxifloxacin Nausea Only   Prednisone    Other reaction(s): Other (See Comments)  Sensitivity      Medication List       Accurate as of 01/23/17  3:21 PM. Always use your most recent med list.          amoxicillin 875 MG tablet Commonly known as:  AMOXIL amoxicillin 875 mg tablet   ampicillin 500 MG capsule Commonly known as:  PRINCIPEN ampicillin 500 mg capsule  2 TABS HR PRIOR TO DENTAL WIRK;  1 TAB 6 HR AFTER DENTAL WORK   AVELOX PO Avelox   BYSTOLIC 10 MG tablet Generic drug:  nebivolol Bystolic 10 mg tablet   celecoxib 200 MG capsule Commonly known as:  CELEBREX   cetirizine 10 MG tablet Commonly known as:  ZYRTEC Take by mouth.   ciprofloxacin 500 MG tablet Commonly known as:  CIPRO ciprofloxacin 500 mg tablet   conjugated estrogens vaginal cream Commonly known as:   PREMARIN Place 1 Applicatorful vaginally daily.   diazepam 5 MG tablet Commonly known as:  VALIUM diazepam 5 mg tablet   docusate sodium 100 MG capsule Commonly known as:  COLACE Take by mouth.   EPIPEN 2-PAK 0.3 mg/0.3 mL Soaj injection Generic drug:  EPINEPHrine EpiPen 2-Pak 0.3 mg/0.3 mL injection, auto-injector   estradiol 2 MG tablet Commonly known as:  ESTRACE estradiol 2 mg tablet   fexofenadine 180 MG tablet Commonly known as:  ALLEGRA Take by mouth.   fluconazole 150 MG tablet Commonly known as:  DIFLUCAN fluconazole 150 mg tablet   GENTAK 0.3 % ophthalmic ointment Generic drug:  gentamicin Gentak 0.3 % (3 mg/gram) eye ointment   gentamicin ointment 0.1 % Commonly known as:  GARAMYCIN gentamicin 0.1 % topical ointment   lansoprazole 30 MG capsule Commonly known as:  PREVACID lansoprazole 30 mg capsule,delayed release   levothyroxine 50 MCG tablet Commonly known as:  SYNTHROID, LEVOTHROID Take by mouth.   levothyroxine 50 MCG tablet Commonly known as:  SYNTHROID, LEVOTHROID Take by mouth.   metroNIDAZOLE 500 MG tablet Commonly known as:  FLAGYL metronidazole 500 mg tablet   MOVIPREP 100 g Solr Generic drug:  peg 3350 powder MoviPrep 100 g-7.5 g-2.691 g-4.7 g oral powder packet   NASONEX 50 MCG/ACT nasal spray Generic drug:  mometasone Nasonex 50 mcg/actuation Spray   nitrofurantoin (macrocrystal-monohydrate) 100 MG capsule Commonly known as:  MACROBID   olmesartan 40 MG tablet Commonly known as:  BENICAR Take by mouth.   PERCOCET 5-325 MG tablet Generic drug:  oxyCODONE-acetaminophen Percocet 5 mg-325 mg tablet  TAKE 1-2 TABS PO EVERY 4-6  HOURS PRN NO MORE THAN 8 DAILY   PHENADOZ 25 MG suppository Generic drug:  promethazine Phenadoz 25 mg rectal suppository   promethazine 25 MG tablet Commonly known as:  PHENERGAN promethazine 25 mg tablet   polyethylene glycol powder powder Commonly known as:  GLYCOLAX/MIRALAX Take by mouth.    SYNVISC ONE 48 MG/6ML Sosy Generic drug:  Hylan Synvisc-One 48 mg/6 mL intra-articular syringe   triamcinolone ointment 0.1 % Commonly known as:  KENALOG triamcinolone acetonide 0.1 % topical ointment   VAGIFEM 10 MCG Tabs vaginal tablet Generic drug:  Estradiol Vagifem 10 mcg vaginal tablet   venlafaxine XR 150 MG 24 hr capsule Commonly known as:  EFFEXOR-XR Take by mouth.   venlafaxine XR 150 MG 24 hr capsule Commonly known as:  EFFEXOR-XR TAKE 1 CAPSULE DAILY   VESICARE 5 MG tablet Generic drug:  solifenacin Vesicare 5 mg tablet   VICTOZA 18 MG/3ML Sopn Generic drug:  liraglutide Victoza 2-Pak 0.6 mg/0.1 mL (18 mg/3  mL) subcutaneous pen injector   1.2 mg every day by sub-q route.       Allergies:  Allergies  Allergen Reactions  . Tape Rash  . Levofloxacin Nausea Only  . Moxifloxacin Nausea Only  . Prednisone     Other reaction(s): Other (See Comments) Sensitivity    Family History: Family History  Problem Relation Age of Onset  . Prostate cancer Brother   . Breast cancer Neg Hx   . Kidney cancer Neg Hx   . Bladder Cancer Neg Hx     Social History:  reports that she has never smoked. She has never used smokeless tobacco. She reports that she drinks alcohol. She reports that she does not use drugs.  ROS: UROLOGY Frequent Urination?: No Hard to postpone urination?: No Burning/pain with urination?: No Get up at night to urinate?: Yes Leakage of urine?: No Urine stream starts and stops?: No Trouble starting stream?: No Do you have to strain to urinate?: No Blood in urine?: No Urinary tract infection?: No Sexually transmitted disease?: No Injury to kidneys or bladder?: No Painful intercourse?: Yes Weak stream?: No Currently pregnant?: No Vaginal bleeding?: No Last menstrual period?: n  Gastrointestinal Nausea?: No Vomiting?: No Indigestion/heartburn?: No Diarrhea?: No Constipation?: No  Constitutional Fever: No Night sweats?: No Weight  loss?: No Fatigue?: No  Skin Skin rash/lesions?: No Itching?: No  Eyes Blurred vision?: No Double vision?: No  Ears/Nose/Throat Sore throat?: No Sinus problems?: No  Hematologic/Lymphatic Swollen glands?: No Easy bruising?: No  Cardiovascular Leg swelling?: No Chest pain?: No  Respiratory Cough?: No Shortness of breath?: No  Endocrine Excessive thirst?: No  Musculoskeletal Back pain?: No Joint pain?: No  Neurological Headaches?: No Dizziness?: No  Psychologic Depression?: No Anxiety?: No  Physical Exam: BP 123/84   Pulse 69   Ht 5\' 5"  (1.651 m)   Wt 225 lb 9.6 oz (102.3 kg)   BMI 37.54 kg/m   Constitutional: Well nourished. Alert and oriented, No acute distress. HEENT: [place] AT, moist mucus membranes. Trachea midline, no masses. Cardiovascular: No clubbing, cyanosis, or edema. Respiratory: Normal respiratory effort, no increased work of breathing. GI: Abdomen is soft, non tender, non distended, no abdominal masses. Liver and spleen not palpable.  No hernias appreciated.  Stool sample for occult testing is not indicated.   GU: No CVA tenderness.  No bladder fullness or masses.  Atrophic external genitalia, normal pubic hair distribution, no lesions.  Normal urethral meatus, no lesions, no prolapse, no discharge.   No urethral masses, tenderness and/or tenderness. No bladder fullness, tenderness or masses. Pale vagina mucosa, poor estrogen effect, no discharge, no lesions, good pelvic support, no cystocele or rectocele noted.  Cervix, uterus and adnexa are surgically absent.  Anus and perineum are without rashes or lesions.    Skin: No rashes, bruises or suspicious lesions. Lymph: No cervical or inguinal adenopathy. Neurologic: Grossly intact, no focal deficits, moving all 4 extremities. Psychiatric: Normal mood and affect.   Assessment & Plan:    1. History of hematuria  - work up completed with CTU and cystoscopy - no malignancies were found  - Patient  will report any gross hematuria  - We'll recheck UA in one year   2. History of recurrent UTI's  - no UTI since last visit  3. Vaginal atrophy  - continue to apply three nights weekly  4. Dyspareunia  - Lubricants and vaginal estrogen cream has not been effective in helping with the pain during intercourse       -  will refer to gynecology and PT for further evaluation  5. Pelvic Lam Dysfunction/Vaginismus   - see above                                                                 Return in about 1 year (around 01/23/2018) for UA and exam.  These notes generated with voice recognition software. I apologize for typographical errors.  Michiel CowboySHANNON Chevon Laufer, PA-C  [facility] [address], [address] [place], [place] [address] [phone]

## 2017-01-23 ENCOUNTER — Ambulatory Visit (INDEPENDENT_AMBULATORY_CARE_PROVIDER_SITE_OTHER): Payer: Medicare Other | Admitting: Urology

## 2017-01-23 ENCOUNTER — Encounter: Payer: Self-pay | Admitting: Urology

## 2017-01-23 VITALS — BP 123/84 | HR 69 | Ht 65.0 in | Wt 225.6 lb

## 2017-01-23 DIAGNOSIS — M6289 Other specified disorders of muscle: Secondary | ICD-10-CM

## 2017-01-23 DIAGNOSIS — N941 Unspecified dyspareunia: Secondary | ICD-10-CM

## 2017-01-23 DIAGNOSIS — Z87440 Personal history of urinary (tract) infections: Secondary | ICD-10-CM

## 2017-01-23 DIAGNOSIS — N952 Postmenopausal atrophic vaginitis: Secondary | ICD-10-CM | POA: Diagnosis not present

## 2017-01-23 DIAGNOSIS — Z87448 Personal history of other diseases of urinary system: Secondary | ICD-10-CM

## 2017-01-31 DIAGNOSIS — H2511 Age-related nuclear cataract, right eye: Secondary | ICD-10-CM | POA: Diagnosis not present

## 2017-03-08 ENCOUNTER — Encounter: Payer: Self-pay | Admitting: Obstetrics and Gynecology

## 2017-03-08 ENCOUNTER — Ambulatory Visit (INDEPENDENT_AMBULATORY_CARE_PROVIDER_SITE_OTHER): Payer: Medicare Other | Admitting: Obstetrics and Gynecology

## 2017-03-08 VITALS — BP 112/72 | HR 79 | Ht 65.0 in | Wt 226.1 lb

## 2017-03-08 DIAGNOSIS — Z90710 Acquired absence of both cervix and uterus: Secondary | ICD-10-CM | POA: Insufficient documentation

## 2017-03-08 DIAGNOSIS — N942 Vaginismus: Secondary | ICD-10-CM | POA: Diagnosis not present

## 2017-03-08 DIAGNOSIS — Z9079 Acquired absence of other genital organ(s): Secondary | ICD-10-CM

## 2017-03-08 DIAGNOSIS — N941 Unspecified dyspareunia: Secondary | ICD-10-CM

## 2017-03-08 DIAGNOSIS — N952 Postmenopausal atrophic vaginitis: Secondary | ICD-10-CM

## 2017-03-08 DIAGNOSIS — Z780 Asymptomatic menopausal state: Secondary | ICD-10-CM | POA: Diagnosis not present

## 2017-03-08 DIAGNOSIS — Z90722 Acquired absence of ovaries, bilateral: Secondary | ICD-10-CM

## 2017-03-08 MED ORDER — ESTRADIOL 0.1 MG/GM VA CREA: 0.2500 | TOPICAL_CREAM | Freq: Every day | VAGINAL | 3 refills | Status: DC

## 2017-03-08 MED ORDER — ESTRADIOL 10 MCG VA TABS: 10.0000 ug | ORAL_TABLET | Freq: Every day | VAGINAL | 6 refills | Status: DC

## 2017-03-08 NOTE — Progress Notes (Signed)
GYN ENCOUNTER NOTE  Subjective:       Holly Lam is a 66 y.o. G52P2002 female is here for gynecologic evaluation of the following issues:  1. dyspareunia.    19-year-old white female para 2002, status post TAH/BSO in 1998, on estrogen replacement therapy 1 mg a day from 1998 through 2016, status post anterior/posterior colporrhaphy in 2012, has noted a gradual increase in worsening dyspareunia since her vaginal repair. Over the past 8 months she has not been able to have a vaginal intercourse as they're has not has been any penetration during that time. The patient has used K-Y jelly and several sample lubricants in the past without success  The patient denies of vulvar symptoms of itching or burning.  Urology has started the patient on topical estrogen cream using a pea sized drop applied to the urethra and introitus.   Gynecologic History No LMP recorded. Patient has had a hysterectomy. Contraception: status post hysterectomy Last Pap: o history of abnormal Pap smears  Obstetric History OB History  Gravida Para Term Preterm AB Living  2 2 2     2   SAB TAB Ectopic Multiple Live Births          2    # Outcome Date GA Lbr Len/2nd Weight Sex Delivery Anes PTL Lv  2 Term 1975   7 lb 1.9 oz (3.23 kg) F Vag-Spont   LIV  1 Term 1973   8 lb 4.8 oz (3.765 kg) M Vag-Spont   LIV      Past Medical History:  Diagnosis Date  . Arthritis   . Diabetes mellitus without complication (HCC)   . Heartburn   . Hyperlipidemia   . Hypertension   . Hypothyroidism     Past Surgical History:  Procedure Laterality Date  . ABDOMINAL HYSTERECTOMY    . ABDOMINAL HYSTERECTOMY     total- painful periods  . CHOLECYSTECTOMY  1980  . INCONTINENCE SURGERY  2012   also cystocele and rectocele  . leg vein surgery Right 06/2016  . NASAL SINUS SURGERY  2002  . REPLACEMENT TOTAL KNEE Left 2012  . THYROID SURGERY  2002  . ULNAR NERVE TRANSPOSITION Left 2007    No current outpatient prescriptions on file  prior to visit.   No current facility-administered medications on file prior to visit.     Allergies  Allergen Reactions  . Tape Rash  . Levofloxacin Nausea Only  . Moxifloxacin Nausea Only  . Prednisone     Other reaction(s): Other (See Comments) Sensitivity    Social History   Social History  . Marital status: Married    Spouse name: N/A  . Number of children: N/A  . Years of education: N/A   Occupational History  . Not on file.   Social History Main Topics  . Smoking status: Never Smoker  . Smokeless tobacco: Never Used  . Alcohol use Yes     Comment: seldom  . Drug use: No  . Sexual activity: Yes    Birth control/ protection: Surgical   Other Topics Concern  . Not on file   Social History Narrative  . No narrative on file    Family History  Problem Relation Age of Onset  . Prostate cancer Brother   . Diabetes Father   . Colon cancer Paternal Aunt   . Breast cancer Neg Hx   . Kidney cancer Neg Hx   . Bladder Cancer Neg Hx   . Ovarian cancer Neg Hx  The following portions of the patient's history were reviewed and updated as appropriate: allergies, current medications, past family history, past medical history, past social history, past surgical history and problem list.  Review of Systems 12 point review of systems is negative except for that noted in the history of present illness  Objective:   BP 112/72   Pulse 79   Ht 5\' 5"  (1.651 m)   Wt 226 lb 1.6 oz (102.6 kg)   BMI 37.63 kg/m  CONSTITUTIONAL: Well-developed, well-nourished female in no acute distress.  HENT:  Normocephalic, atraumatic.  NECK: Normal range of motion, supple, no masses.  Normal thyroid.  SKIN: Skin is warm and dry. No rash noted. Not diaphoretic. No erythema. No pallor. NEUROLGIC: Alert and oriented to person, place, and time. PSYCHIATRIC: Normal mood and affect. Normal behavior. Normal judgment and thought content. CARDIOVASCULAR:Not Examined RESPIRATORY: Not  Examined BREASTS: Not Examined ABDOMEN: Soft, non distended; Non tender.  No Organomegaly. PELVIC:  External Genitalia: Normal;introitus has normal caliber  BUS: Normal  Vagina: slight decreased estrogen effect; introitus does admit to digits without difficulty; depth of vagina is at least 8 cm; no palpable masses, lesions, or focal tenderness  Cervix: surgically absent  Uterus: surgically absent  Adnexa: Normal; nonpalpable and nontender  RV: Normal external exam; normal sphincter tone: No rectal ma  Bladder: Nontender MUSCULOSKELETAL: Normal range of motion. No tenderness.  No cyanosis, clubbing, or edema.     Assessment:   1. Female dyspareunia, unclear etiology; physical exam reveals normal depth and diameter of vagina  2. Vaginal atrophy, mild  3. Status post hysterectomy  4. Menopause  5. Vaginismus     Plan:   1. Begin Estrace 1 mg orally 2. Begin Vagifem 10 g intravaginal twice weekly 3.Encourage lubricants as needed:  Virgin olive oil  Coconut oil  Astroglide  Jo H2O lubricant 4. Consider vaginal dilator therapy. Go to [email or web address] 5. Return in 8 weeks for follow-up 6. Pros and cons of long-term estrogen replacement therapy have been reviewed. Patient desires to return to systemic ERT  Herold HarmsMartin A Michela Herst, MD  Note: This dictation was prepared with Dragon dictation along with smaller phrase technology. Any transcriptional errors that result from this process are unintentional.

## 2017-03-08 NOTE — Patient Instructions (Addendum)
1. Begin Estrace 1 mg orally daily 2. Begin Vagifem 10 g intravaginal 2 times a week 3. Lubricants are to be used as needed:  Virgin olive oil  Coconut oil  Astroglide  Jo H2O lubricant 4. Consider vaginal dilator therapy. Go to [email or web address] 5. Return in 8 weeks for follow-up

## 2017-03-16 ENCOUNTER — Telehealth: Payer: Self-pay | Admitting: Obstetrics and Gynecology

## 2017-03-16 NOTE — Telephone Encounter (Signed)
Patient called stating the pharmacy di not give her both portions of the medication. She also had some confusion as what to take vaginally or orally. Thanks

## 2017-03-22 ENCOUNTER — Other Ambulatory Visit: Payer: Self-pay

## 2017-03-22 MED ORDER — ESTRADIOL 1 MG PO TABS
1.0000 mg | ORAL_TABLET | Freq: Every day | ORAL | 3 refills | Status: DC
Start: 2017-03-22 — End: 2019-08-14

## 2017-03-22 NOTE — Telephone Encounter (Signed)
Pt aware estradiol vaginal cream was rx by mistake. Oral estradiol erx.

## 2017-03-30 ENCOUNTER — Telehealth: Payer: Self-pay | Admitting: Obstetrics and Gynecology

## 2017-03-30 ENCOUNTER — Other Ambulatory Visit: Payer: Self-pay | Admitting: *Deleted

## 2017-03-30 DIAGNOSIS — M25561 Pain in right knee: Secondary | ICD-10-CM | POA: Diagnosis not present

## 2017-03-30 DIAGNOSIS — Z96652 Presence of left artificial knee joint: Secondary | ICD-10-CM | POA: Diagnosis not present

## 2017-03-30 DIAGNOSIS — M1711 Unilateral primary osteoarthritis, right knee: Secondary | ICD-10-CM | POA: Diagnosis not present

## 2017-03-30 DIAGNOSIS — M25562 Pain in left knee: Secondary | ICD-10-CM | POA: Diagnosis not present

## 2017-03-30 MED ORDER — FLUCONAZOLE 150 MG PO TABS: 150.0000 mg | ORAL_TABLET | Freq: Once | ORAL | 1 refills | Status: AC

## 2017-03-30 NOTE — Telephone Encounter (Signed)
Patient called stating that she has an extremely bad yeast infection and she needs a medication to help because her symptoms are worsening. Please advise. Amy filled the prescription 03/30/17.

## 2017-05-02 ENCOUNTER — Ambulatory Visit: Payer: Medicare Other | Attending: Urology | Admitting: Physical Therapy

## 2017-05-02 ENCOUNTER — Encounter: Payer: Self-pay | Admitting: Physical Therapy

## 2017-05-02 DIAGNOSIS — M6281 Muscle weakness (generalized): Secondary | ICD-10-CM | POA: Diagnosis not present

## 2017-05-02 DIAGNOSIS — R2689 Other abnormalities of gait and mobility: Secondary | ICD-10-CM

## 2017-05-02 DIAGNOSIS — R29898 Other symptoms and signs involving the musculoskeletal system: Secondary | ICD-10-CM | POA: Diagnosis not present

## 2017-05-02 NOTE — Patient Instructions (Addendum)
    Currently decrease tea 4-5 glass 12 oz,  1-2 cups of coffee, orange juice sometimes.   Start  2 -16 fl oz water,  Cut tea completely as you stated, and 2 cups of coffee  , eventually and gradually 3- water, 1 cup of coffee    __________________  .   When lifting: Do not bend forward with your back. Instead stand with feet wide and use squat position to lift with the strength of your legs. Exhale as you lift and keep object close to your center as you move it.  . When getting out of bed, do not do a sit-up. Raise arm towards ear (same side as the edge of bed you will get out of), roll towards that side to be in full sideyling position. Then drop legs off edge of bed while pushing up with        bottom arm. (see below)   Standing with one foot infront other hip width apart, 45 deg to counter   Bending with mini squat,  ( inhale) , (exhale up)  Sit to stand , nose over toes, smell the rose (INHALE)  , exhale to stand

## 2017-05-03 NOTE — Therapy (Deleted)
[facility] MAIN [facility] SERVICES [address] [place], [place], [address] Phone: [phone]   Fax:  [phone]  Physical Therapy Treatment  Patient Details  Name: Holly Lam MRN: [id number] Date of Birth: [birth year] Referring Provider: Michiel Cowboy  Encounter Date: 05/02/2017      PT End of Session - 05/03/17 2153    Visit Number 1   Number of Visits 12   Date for PT Re-Evaluation 2017/08/01   Authorization Type g-codes    PT Start Time 0900   PT Stop Time 1005   PT Time Calculation (min) 65 min   Activity Tolerance Patient tolerated treatment well;No increased pain   Behavior During Therapy WFL for tasks assessed/performed      Past Medical History:  Diagnosis Date  . Arthritis   . Diabetes mellitus without complication (HCC)   . Heartburn   . Hyperlipidemia   . Hypertension   . Hypothyroidism     Past Surgical History:  Procedure Laterality Date  . ABDOMINAL HYSTERECTOMY    . ABDOMINAL HYSTERECTOMY     total- painful periods  . CHOLECYSTECTOMY  1980  . INCONTINENCE SURGERY  2012   also cystocele and rectocele  . leg vein surgery Right 06/2016  . NASAL SINUS SURGERY  2002  . REPLACEMENT TOTAL KNEE Left 2012  . THYROID SURGERY  2002  . ULNAR NERVE TRANSPOSITION Left 2007    There were no vitals filed for this visit.          [facility] PT Assessment - 05/03/17 2145      Assessment   Medical Diagnosis pelvic pain    Referring Provider Michiel Cowboy     Precautions   Precautions None     Restrictions   Weight Bearing Restrictions No     Balance Screen   Has the patient fallen in the past 6 months Yes   Has the patient had a decrease in activity level because of a fear of falling?  No     Observation/Other Assessments   Observations slumped sitting      Coordination   Gross Motor Movements are Fluid and Coordinated --  diaphragmatic / pelvic floor ROM      Single Leg Stance   Comments Trendelenberg,  L SLS with R pelvic shift      Other:   Other/ Comments bending forward with knees locked and forward flexion      Strength   Overall Strength Comments L hip flex 4-/5, R 4/5 ( B knee flex/ ext 4/5 )      Palpation   Palpation comment supra pubic scar with restrictions      Ambulation/Gait   Gait Comments decreased L stance phase, slight limp, 1.0 m/s                      [facility] Adult PT Treatment/Exercise - 05/03/17 2147      Therapeutic Activites    Therapeutic Activities --  see pt instructions      Neuro Re-ed    Neuro Re-ed Details  see pt instructions                      PT Long Term Goals - 05/02/17 0944      PT LONG TERM GOAL #1   Title Pt will decrease her PDI score from % to <  % in order to improve ADLs and QOL    Time 12   Period  Weeks   Status New   Target Date 07/25/17     PT LONG TERM GOAL #2   Title Pt will report decreased back pain from 8/10 to < 2/10 with standing and cooking for 2 hours in order to feed her family.    Time 8   Period Weeks   Status New   Target Date 06/27/17     PT LONG TERM GOAL #3   Title Pt will demo increased L hip flex strength from 4-/5 to 4/5 in order to improve gait and aerobic fitness   Time 12   Period Weeks   Status New   Target Date 07/25/17     PT LONG TERM GOAL #4   Title Pt will demo proper body mechanics with functional activities and exercises to minmize strain on pelvic floor and back and promote fitness   Time 10   Period Weeks   Status New   Target Date 07/11/17     PT LONG TERM GOAL #5   Title Pt will demo proper pelvic floor ROM and coordination in order to minimize pelvic pain and tolerate pelvic exams   Time 4   Period Weeks   Status New   Target Date 05/31/17               Plan - 05/03/17 11-22-55    Clinical Impression Statement Pt is a 66 yo female who c/o pelvic pain, CLBP, B knee pain. These deficits impact her ADLs and QOL. Pt 's clincial presentations  include suprapubic scar restrictions, deep core weakness, poor loading ability through pelvic girdle in single leg stance, weak hip strength, gait deviations, and poor body mechanics with exercises/ functional activities which place strain on pelvic floor and back.  Pt's inake of excessive bladder irritants  and limited water place her at risk for urinary issues and poor health. Following Tx, pt demo'd proper body  mechanics with lifting weights and getting out bed to not place strain on her pelvic floor mm. Plan to assess pelvic floor at next session.      History and Personal Factors relevant to plan of care: Abdominal hysterectomy and first bladder tack in 1997 due to painful periods. Lost 10 lb since Feb , L TKA    Clinical Presentation Evolving   Clinical Decision Making Moderate   Rehab Potential Good   PT Frequency 1x / week   PT Duration 12 weeks   PT Treatment/Interventions Manual techniques;Neuromuscular re-education;Functional mobility training;Moist Heat;Therapeutic exercise;Therapeutic activities;Patient/family education;Energy conservation;Taping;Aquatic Therapy;Scar mobilization;Balance training;Stair training;Gait training   Consulted and Agree with Plan of Care Patient      Patient will benefit from skilled therapeutic intervention in order to improve the following deficits and impairments:  Pain, Improper body mechanics, Increased fascial restricitons, Decreased mobility, Decreased coordination, Decreased endurance, Decreased range of motion, Decreased strength, Decreased activity tolerance, Decreased safety awareness, Obesity, Hypomobility, Increased muscle spasms, Abnormal gait, Postural dysfunction, Decreased scar mobility, Difficulty walking  Visit Diagnosis: Other abnormalities of gait and mobility  Muscle weakness (generalized)  Other symptoms and signs involving the musculoskeletal system       G-Codes - 05-09-2017 Nov 22, 2207    Functional Assessment Tool Used (Outpatient  Only) ODI, PDI, clinical judegment   Functional Limitation Self care   Self Care Current Status (Y7829) At least 40 percent but less than 60 percent impaired, limited or restricted   Self Care Goal Status (F6213) At least 20 percent but less than 40 percent impaired, limited or restricted  Problem List Patient Active Problem List   Diagnosis Date Noted  . Vaginismus 03/08/2017  . Menopause 03/08/2017  . Status post hysterectomy 03/08/2017  . Vaginal atrophy 03/08/2017  . Female dyspareunia 03/08/2017  . Varicose veins of both lower extremities with pain 06/15/2016  . Chronic venous insufficiency 06/15/2016  . Pain in limb 06/15/2016    Mariane MastersYeung,Shin Yiing 05/03/2017, 10:10 PM  [facility] [facility] [address] [place], [place], [address] Phone: [phone]   Fax:  [phone]  Name: Holly Lam MRN: [id number] Date of Birth: 04/22/1951

## 2017-05-03 NOTE — Therapy (Signed)
[place] MAIN [facility] SERVICES [address] [place], [place], [address] Phone: [phone]   Fax:  [phone]  Physical Therapy Evaluation  Patient Details  Name: Holly Lam MRN: [id number] Date of Birth: [birth year] Referring Provider: Michiel Cowboy  Encounter Date: 05/02/2017      PT End of Session - 05/03/17 2153    Visit Number 1   Number of Visits 12   Date for PT Re-Evaluation 2017-08-15   Authorization Type g-codes    PT Start Time 0900   PT Stop Time 1005   PT Time Calculation (min) 65 min   Activity Tolerance Patient tolerated treatment well;No increased pain   Behavior During Therapy WFL for tasks assessed/performed      Past Medical History:  Diagnosis Date  . Arthritis   . Diabetes mellitus without complication (HCC)   . Heartburn   . Hyperlipidemia   . Hypertension   . Hypothyroidism     Past Surgical History:  Procedure Laterality Date  . ABDOMINAL HYSTERECTOMY    . ABDOMINAL HYSTERECTOMY     total- painful periods  . CHOLECYSTECTOMY  1980  . INCONTINENCE SURGERY  2012   also cystocele and rectocele  . leg vein surgery Right 06/2016  . NASAL SINUS SURGERY  2002  . REPLACEMENT TOTAL KNEE Left 2012  . THYROID SURGERY  2002  . ULNAR NERVE TRANSPOSITION Left 2007    There were no vitals filed for this visit.       Subjective Assessment - 05/03/17 2211    Subjective 1) Pt has had pelvic pain gradually the surgery 2012 for cystocele, rectocele, and bladder sling repair at the same time.  The pain occurs with gynecology exams and sexual intercourse.  Pt had raw sensations in vaginal area.  Pt does not know if it is a muscle that is contracting or know what it is.  Pt has had 6 UTIs across 6 months between 2017 and 2018. UTIs resolved with medicaitons for vaginal dryness. Denied urinary/ fecal leakage. Pt works out at [employer] with weight machines for arms (30 -35 lb)  , legs ( 90lbs) for 2-3 x/ week. Pt started  abs and crunches a few weeks but she not completely stuck with a routine. Pt does not have a flexibilty program. Daily water intake: "probably no enough". Pt drinks tea 4-5 glass 12 oz,  1-2 cups of coffee, orange juice sometimes.      2)  CLBP: pt was told by her provider she had spinal stenosis. Pain occurs was standing and cooking for 2 hours, or shopping for a couple of hours, 7/10 pain at the low back.    3) B knee pain: Pt had L TKA ub 2012. Currently , B knee with walking occurs sparodically. Pt went to PT for L knee after surgery and wanted to cotinue the benfits from by joining the gym.    Pertinent History Abdominal hysterectomy and first bladder tack in 1997 due to painful periods. Lost 10 lb since Feb , L TKA    Patient Stated Goals decrease pelvic pain, lose weight             [facility] PT Assessment - 05/03/17 2145      Assessment   Medical Diagnosis pelvic pain    Referring Provider Michiel Cowboy     Precautions   Precautions None     Restrictions   Weight Bearing Restrictions No     Balance Screen   Has the  patient fallen in the past 6 months Yes   Has the patient had a decrease in activity level because of a fear of falling?  No     Observation/Other Assessments   Observations slumped sitting      Coordination   Gross Motor Movements are Fluid and Coordinated --  diaphragmatic / pelvic floor ROM      Single Leg Stance   Comments Trendelenberg, L SLS with R pelvic shift      Other:   Other/ Comments bending forward with knees locked and forward flexion      Strength   Overall Strength Comments L hip flex 4-/5, R 4/5 ( B knee flex/ ext 4/5 )      Palpation   Palpation comment supra pubic scar with restrictions      Ambulation/Gait   Gait Comments decreased L stance phase, slight limp, 1.0 m/s             Objective measurements completed on examination: See above findings.          [facility] Adult PT Treatment/Exercise - 05/03/17 2147       Therapeutic Activites    Therapeutic Activities --  see pt instructions      Neuro Re-ed    Neuro Re-ed Details  see pt instructions                      PT Long Term Goals - 05/02/17 0944      PT LONG TERM GOAL #1   Title Pt will decrease her PDI score from % to <  % in order to improve ADLs and QOL    Time 12   Period Weeks   Status New   Target Date 07/25/17     PT LONG TERM GOAL #2   Title Pt will report decreased back pain from 8/10 to < 2/10 with standing and cooking for 2 hours in order to feed her family.    Time 8   Period Weeks   Status New   Target Date 06/27/17     PT LONG TERM GOAL #3   Title Pt will demo increased L hip flex strength from 4-/5 to 4/5 in order to improve gait and aerobic fitness   Time 12   Period Weeks   Status New   Target Date 07/25/17     PT LONG TERM GOAL #4   Title Pt will demo proper body mechanics with functional activities and exercises to minmize strain on pelvic floor and back and promote fitness   Time 10   Period Weeks   Status New   Target Date 07/11/17     PT LONG TERM GOAL #5   Title Pt will demo proper pelvic floor ROM and coordination in order to minimize pelvic pain and tolerate pelvic exams   Time 4   Period Weeks   Status New   Target Date 05/31/17                Plan - 05/03/17 2157    Clinical Impression Statement Pt is a 66 yo female who c/o pelvic pain, CLBP, B knee pain. These deficits impact her ADLs and QOL. Pt 's clincial presentations include suprapubic scar restrictions, deep core weakness, poor loading ability through pelvic girdle in single leg stance, weak hip strength, gait deviations, and poor body mechanics with exercises/ functional activities which place strain on pelvic floor and back.  Pt's inake of excessive bladder irritants  and limited water place her at risk for urinary issues and poor health. Following Tx, pt demo'd proper body  mechanics with lifting weights and getting  out bed to not place strain on her pelvic floor mm. Pt was also educated about balancing out bladder irritant and water intake.  Plan to assess pelvic floor at next session.      History and Personal Factors relevant to plan of care: Abdominal hysterectomy and first bladder tack in 1997 due to painful periods. Lost 10 lb since Feb , L TKA    Clinical Presentation Evolving   Clinical Decision Making Moderate   Rehab Potential Good   PT Frequency 1x / week   PT Duration 12 weeks   PT Treatment/Interventions Manual techniques;Neuromuscular re-education;Functional mobility training;Moist Heat;Therapeutic exercise;Therapeutic activities;Patient/family education;Energy conservation;Taping;Aquatic Therapy;Scar mobilization;Balance training;Stair training;Gait training   Consulted and Agree with Plan of Care Patient      Patient will benefit from skilled therapeutic intervention in order to improve the following deficits and impairments:  Pain, Improper body mechanics, Increased fascial restricitons, Decreased mobility, Decreased coordination, Decreased endurance, Decreased range of motion, Decreased strength, Decreased activity tolerance, Decreased safety awareness, Obesity, Hypomobility, Increased muscle spasms, Abnormal gait, Postural dysfunction, Decreased scar mobility, Difficulty walking  Visit Diagnosis: Other abnormalities of gait and mobility  Muscle weakness (generalized)  Other symptoms and signs involving the musculoskeletal system      G-Codes - 05/02/17 2209    Functional Assessment Tool Used (Outpatient Only) ODI, PDI, clinical judegment   Functional Limitation Self care   Self Care Current Status (E4540(G8987) At least 40 percent but less than 60 percent impaired, limited or restricted   Self Care Goal Status (J8119(G8988) At least 20 percent but less than 40 percent impaired, limited or restricted       Problem List Patient Active Problem List   Diagnosis Date Noted  . Vaginismus  03/08/2017  . Menopause 03/08/2017  . Status post hysterectomy 03/08/2017  . Vaginal atrophy 03/08/2017  . Female dyspareunia 03/08/2017  . Varicose veins of both lower extremities with pain 06/15/2016  . Chronic venous insufficiency 06/15/2016  . Pain in limb 06/15/2016    Mariane MastersYeung,Shin Yiing  ,PT, DPT, E-RYT  05/03/2017, 10:11 PM  [place] [facility] MAIN [facility] [address] [place], [place], [address] Phone: [phone]   Fax:  [phone]  Name: Holly Lam MRN: [id number] Date of Birth: 12/20/1950

## 2017-05-08 ENCOUNTER — Ambulatory Visit: Payer: Medicare Other | Admitting: Physical Therapy

## 2017-05-08 ENCOUNTER — Encounter: Payer: Medicare Other | Admitting: Obstetrics and Gynecology

## 2017-05-08 DIAGNOSIS — M6281 Muscle weakness (generalized): Secondary | ICD-10-CM | POA: Diagnosis not present

## 2017-05-08 DIAGNOSIS — R2689 Other abnormalities of gait and mobility: Secondary | ICD-10-CM

## 2017-05-08 DIAGNOSIS — R29898 Other symptoms and signs involving the musculoskeletal system: Secondary | ICD-10-CM

## 2017-05-08 NOTE — Therapy (Signed)
[place] [facility] MAIN [facility] SERVICES [address] [place], [place], [address] Phone: [phone]   Fax:  [phone]  Physical Therapy Treatment  Patient Details  Name: Holly Lam MRN: [id number] Date of Birth: 1950/12/13 Referring Provider: Michiel Cowboy  Encounter Date: 05/08/2017      PT End of Session - 05/08/17 1213    Visit Number 2   Number of Visits 12   Date for PT Re-Evaluation August 24, 2017   Authorization Type g-codes    PT Start Time 1103   PT Stop Time 1204   PT Time Calculation (min) 61 min   Activity Tolerance Patient tolerated treatment well;No increased pain   Behavior During Therapy WFL for tasks assessed/performed      Past Medical History:  Diagnosis Date  . Arthritis   . Diabetes mellitus without complication (HCC)   . Heartburn   . Hyperlipidemia   . Hypertension   . Hypothyroidism     Past Surgical History:  Procedure Laterality Date  . ABDOMINAL HYSTERECTOMY    . ABDOMINAL HYSTERECTOMY     total- painful periods  . CHOLECYSTECTOMY  1980  . INCONTINENCE SURGERY  2012   also cystocele and rectocele  . leg vein surgery Right 06/2016  . NASAL SINUS SURGERY  2002  . REPLACEMENT TOTAL KNEE Left 2012  . THYROID SURGERY  2002  . ULNAR NERVE TRANSPOSITION Left 2007    There were no vitals filed for this visit.      Subjective Assessment - 05/08/17 1111    Subjective Pt reported her knees hurt with mini squats. Pt reported she has been practicing exhale with sit to stand and adjusting her feet in the kitchen. Pt plans to delegate in the kitchen with family members   Pertinent History Abdominal hysterectomy and first bladder tack in 1997 due to painful periods. Lost 10 lb since Feb , L TKA    Patient Stated Goals decrease pelvic pain, lose weight             [facility] PT Assessment - 05/08/17 1158      Coordination   Gross Motor Movements are Fluid and Coordinated --  shallow breathing, limited pelvic floor  ROM                  Pelvic Floor Special Questions - 05/08/17 1157    Pelvic Floor Internal Exam pt consented verbally without contraindications after being explained details to the exam    Exam Type Vaginal   Palpation increased tensions 3rd layer B ( L > R), scar restrictions 5 and 7 o clock, tenderness at L 2 and 5 clock ( post Tx: decreased). Cued for co-activation of feet to relax pelvic floor . pelvic tilts relaxed deep layer as well    Strength fair squeeze, definite lift  at 3rd layer, delayed lengthening           [facility] Adult PT Treatment/Exercise - 05/08/17 0001      Neuro Re-ed    Neuro Re-ed Details  cued for pelvic floor relaxation and deepc ore coordination . educated about avoiding self-selected abdominal exercise      Manual Therapy   Internal Pelvic Floor thiele massage at problem areas indicated in assessment                      PT Long Term Goals - 05/02/17 0944      PT LONG TERM GOAL #1   Title Pt will decrease  her PDI score from % to <  % in order to improve ADLs and QOL    Time 12   Period Weeks   Status New   Target Date 07/25/17     PT LONG TERM GOAL #2   Title Pt will report decreased back pain from 8/10 to < 2/10 with standing and cooking for 2 hours in order to feed her family.    Time 8   Period Weeks   Status New   Target Date 06/27/17     PT LONG TERM GOAL #3   Title Pt will demo increased L hip flex strength from 4-/5 to 4/5 in order to improve gait and aerobic fitness   Time 12   Period Weeks   Status New   Target Date 07/25/17     PT LONG TERM GOAL #4   Title Pt will demo proper body mechanics with functional activities and exercises to minmize strain on pelvic floor and back and promote fitness   Time 10   Period Weeks   Status New   Target Date 07/11/17     PT LONG TERM GOAL #5   Title Pt will demo proper pelvic floor ROM and coordination in order to minimize pelvic pain and tolerate pelvic exams    Time 4   Period Weeks   Status New   Target Date 05/31/17               Plan - 05/08/17 1213    Clinical Impression Statement Pt showed signficantly decreased pelvic floor tightness andscar restrictions following Tx today. pt demo'd pelvic floor coordination and increased ROM following training.  Pt requried cues for paced breathing, pelvic tilts, and co-activation of feet  to relax pelvic floor  Progressed to deep core strengthening and educated pt to withhold her self-selected abdominal strengthening exercise due to associated paradoxical pelvic floor movement. Pt voiced understanding.. Plan to address lower kinetic chain to minimize overuse of L pelvic floor and strengthen LE weakness 2/2 L TKA.  Pt continues to benefit form skilled PT.     Rehab Potential Good   PT Frequency 1x / week   PT Duration 12 weeks   PT Treatment/Interventions Manual techniques;Neuromuscular re-education;Functional mobility training;Moist Heat;Therapeutic exercise;Therapeutic activities;Patient/family education;Energy conservation;Taping;Aquatic Therapy;Scar mobilization;Balance training;Stair training;Gait training   Consulted and Agree with Plan of Care Patient      Patient will benefit from skilled therapeutic intervention in order to improve the following deficits and impairments:  Pain, Improper body mechanics, Increased fascial restricitons, Decreased mobility, Decreased coordination, Decreased endurance, Decreased range of motion, Decreased strength, Decreased activity tolerance, Decreased safety awareness, Obesity, Hypomobility, Increased muscle spasms, Abnormal gait, Postural dysfunction, Decreased scar mobility, Difficulty walking  Visit Diagnosis: Other abnormalities of gait and mobility  Muscle weakness (generalized)  Other symptoms and signs involving the musculoskeletal system     Problem List Patient Active Problem List   Diagnosis Date Noted  . Vaginismus 03/08/2017  . Menopause  03/08/2017  . Status post hysterectomy 03/08/2017  . Vaginal atrophy 03/08/2017  . Female dyspareunia 03/08/2017  . Varicose veins of both lower extremities with pain 06/15/2016  . Chronic venous insufficiency 06/15/2016  . Pain in limb 06/15/2016    Mariane MastersYeung,Shin Yiing ,PT, DPT, E-RYT 05/08/2017, 12:17 PM  [facility] [address] [place], [place], [address] Phone: [phone]   Fax:  [phone]  Name: Holly Lam MRN: [id number] Date of Birth: 06/16/1951

## 2017-05-08 NOTE — Patient Instructions (Addendum)
Proper technique for mini squats:   Minisquat: Scoot buttocks back slightly, hinge like you are looking at your reflection on a pond  Knees behind toes,  Inhale to "smell flowers"  Exhale on the rise "like rocket"  Do not lock knees, have more weight across ballmounds of feet, toes relaxed   10 reps x 3 x day   _________   Deep core level 1-2 ( handout)   Pelvic tilts and feet planted to relax pelvic floor

## 2017-05-10 ENCOUNTER — Encounter: Payer: Medicare Other | Admitting: Physical Therapy

## 2017-05-17 ENCOUNTER — Ambulatory Visit: Payer: Medicare Other | Attending: Urology | Admitting: Physical Therapy

## 2017-05-17 DIAGNOSIS — M6281 Muscle weakness (generalized): Secondary | ICD-10-CM

## 2017-05-17 DIAGNOSIS — R2689 Other abnormalities of gait and mobility: Secondary | ICD-10-CM | POA: Diagnosis not present

## 2017-05-17 DIAGNOSIS — R29898 Other symptoms and signs involving the musculoskeletal system: Secondary | ICD-10-CM | POA: Diagnosis not present

## 2017-05-17 DIAGNOSIS — E039 Hypothyroidism, unspecified: Secondary | ICD-10-CM | POA: Diagnosis not present

## 2017-05-17 DIAGNOSIS — I10 Essential (primary) hypertension: Secondary | ICD-10-CM | POA: Diagnosis not present

## 2017-05-17 NOTE — Patient Instructions (Addendum)
Relaxation ( body scan )    Happy baby pose with yoga strap  With ballmounds and toes spread on it  5 breaths    Figure -4 stretch with heels slide . Do not lift leg up 5 each     ___  Deep core :  Focus on lengthen and also feet planted down with knees aligned with hip width

## 2017-05-17 NOTE — Therapy (Signed)
[place] [address] [place], [place], [address] Phone: [phone]   Fax:  [phone]  Physical Therapy Treatment  Patient Details  Name: Holly Lam MRN: [id number] Date of Birth: 05/17/1951 Referring Provider: Michiel CowboyShannon McGowan   Encounter Date: 05/17/2017  PT End of Session - 05/17/17 1206    Visit Number  3    Number of Visits  12    Date for PT Re-Evaluation  07/25/17    Authorization Type  g-codes     PT Start Time  1103    PT Stop Time  1206    PT Time Calculation (min)  63 min    Activity Tolerance  Patient tolerated treatment well;No increased pain    Behavior During Therapy  WFL for tasks assessed/performed       Past Medical History:  Diagnosis Date  . Arthritis   . Diabetes mellitus without complication (HCC)   . Heartburn   . Hyperlipidemia   . Hypertension   . Hypothyroidism     Past Surgical History:  Procedure Laterality Date  . ABDOMINAL HYSTERECTOMY    . ABDOMINAL HYSTERECTOMY     total- painful periods  . CHOLECYSTECTOMY  1980  . INCONTINENCE SURGERY  2012   also cystocele and rectocele  . leg vein surgery Right 06/2016  . NASAL SINUS SURGERY  2002  . REPLACEMENT TOTAL KNEE Left 2012  . THYROID SURGERY  2002  . ULNAR NERVE TRANSPOSITION Left 2007    There were no vitals filed for this visit.  Subjective Assessment - 05/17/17 1106    Subjective  Pt was on a trip last week but tried practicing the deep core exercise when she could.     Pertinent History  Abdominal hysterectomy and first bladder tack in 1997 due to painful periods. Lost 10 lb since Feb , L TKA     Patient Stated Goals  decrease pelvic pain, lose weight          [facility] - 05/17/17 1205      Functional Tests   Functional tests  -- lifting leg up in piriformis stretch, lumbar lordosis    lifting leg up in piriformis stretch, lumbar lordosis               Pelvic Floor Special Questions -  05/17/17 1158    Pelvic Floor Internal Exam  pt consented verbally without contraindications after being explained details to the exam     Exam Type  Vaginal    Palpation  increased tension at L puborectalis mm, tenderness at 2 o clock, ( decreased post Tx)      Strength  good squeeze, good lift, able to hold agaisnt strong resistance more activation and sequentially post Tx    more activation and sequentially post Tx    Strength # of reps  5    Strength # of seconds  1        [facility] Adult PT Treatment/Exercise - 05/17/17 1200      Neuro Re-ed    Neuro Re-ed Details   see pt instructions ( body scan )       Modalities   Modalities  -- moist heat at perineal with towels   moist heat at perineal with towels     Manual Therapy   Internal Pelvic Floor  thiele massage at problem areas indicated in assessment              PT Education -  05/17/17 1205    Education provided  Yes    Education Details  HEP    Person(s) Educated  Patient    Methods  Explanation;Demonstration;Tactile cues;Verbal cues;Handout    Comprehension  Returned demonstration;Verbalized understanding          PT Long Term Goals - 05/02/17 0944      PT LONG TERM GOAL #1   Title  Pt will decrease her PDI score from % to <  % in order to improve ADLs and QOL     Time  12    Period  Weeks    Status  New    Target Date  07/25/17      PT LONG TERM GOAL #2   Title  Pt will report decreased back pain from 8/10 to < 2/10 with standing and cooking for 2 hours in order to feed her family.     Time  8    Period  Weeks    Status  New    Target Date  06/27/17      PT LONG TERM GOAL #3   Title  Pt will demo increased L hip flex strength from 4-/5 to 4/5 in order to improve gait and aerobic fitness    Time  12    Period  Weeks    Status  New    Target Date  07/25/17      PT LONG TERM GOAL #4   Title  Pt will demo proper body mechanics with functional activities and exercises to minmize strain on pelvic  floor and back and promote fitness    Time  10    Period  Weeks    Status  New    Target Date  07/11/17      PT LONG TERM GOAL #5   Title  Pt will demo proper pelvic floor ROM and coordination in order to minimize pelvic pain and tolerate pelvic exams    Time  4    Period  Weeks    Status  New    Target Date  05/31/17            Plan - 05/17/17 1206    Clinical Impression Statement  Pt demo'd signficantly tenderness and tensions of L pelvic floor mm. Pt achieved increased strength of pelvic floor contraction in a circumferential and sequential way instead of more activation on the R. Added relaxation and pelvic floor stretches into HEP. Included cues for feet co-activation in HEP. Added biopsychosocial apporach with explanation of the role of nervous system with pelvic floor mm in functional activities. Pt contiues to benefit from skilled skilled PT.     Rehab Potential  Good    PT Frequency  1x / week    PT Duration  12 weeks    PT Treatment/Interventions  Manual techniques;Neuromuscular re-education;Functional mobility training;Moist Heat;Therapeutic exercise;Therapeutic activities;Patient/family education;Energy conservation;Taping;Aquatic Therapy;Scar mobilization;Balance training;Stair training;Gait training    Consulted and Agree with Plan of Care  Patient       Patient will benefit from skilled therapeutic intervention in order to improve the following deficits and impairments:  Pain, Improper body mechanics, Increased fascial restricitons, Decreased mobility, Decreased coordination, Decreased endurance, Decreased range of motion, Decreased strength, Decreased activity tolerance, Decreased safety awareness, Obesity, Hypomobility, Increased muscle spasms, Abnormal gait, Postural dysfunction, Decreased scar mobility, Difficulty walking  Visit Diagnosis: Other abnormalities of gait and mobility  Muscle weakness (generalized)  Other symptoms and signs involving the  musculoskeletal system     Problem  List Patient Active Problem List   Diagnosis Date Noted  . Vaginismus 03/08/2017  . Menopause 03/08/2017  . Status post hysterectomy 03/08/2017  . Vaginal atrophy 03/08/2017  . Female dyspareunia 03/08/2017  . Varicose veins of both lower extremities with pain 06/15/2016  . Chronic venous insufficiency 06/15/2016  . Pain in limb 06/15/2016    Mariane MastersYeung,Shin Yiing  ,PT, DPT, E-RYT  05/17/2017, 12:09 PM  [place] [facility] [address] [place], [place], [address] Phone: [phone]   Fax:  [phone]  Name: Holly Lam

## 2017-05-22 DIAGNOSIS — M2550 Pain in unspecified joint: Secondary | ICD-10-CM | POA: Diagnosis not present

## 2017-05-22 DIAGNOSIS — E039 Hypothyroidism, unspecified: Secondary | ICD-10-CM | POA: Diagnosis not present

## 2017-05-22 DIAGNOSIS — E669 Obesity, unspecified: Secondary | ICD-10-CM | POA: Diagnosis not present

## 2017-05-22 DIAGNOSIS — Z23 Encounter for immunization: Secondary | ICD-10-CM | POA: Diagnosis not present

## 2017-05-22 DIAGNOSIS — E042 Nontoxic multinodular goiter: Secondary | ICD-10-CM | POA: Diagnosis not present

## 2017-05-22 DIAGNOSIS — I10 Essential (primary) hypertension: Secondary | ICD-10-CM | POA: Diagnosis not present

## 2017-05-22 DIAGNOSIS — Z0000 Encounter for general adult medical examination without abnormal findings: Secondary | ICD-10-CM | POA: Diagnosis not present

## 2017-05-24 ENCOUNTER — Ambulatory Visit: Payer: Medicare Other | Admitting: Physical Therapy

## 2017-05-24 ENCOUNTER — Other Ambulatory Visit: Payer: Self-pay | Admitting: Family Medicine

## 2017-05-24 DIAGNOSIS — Z1231 Encounter for screening mammogram for malignant neoplasm of breast: Secondary | ICD-10-CM

## 2017-05-29 ENCOUNTER — Encounter: Payer: Self-pay | Admitting: Obstetrics and Gynecology

## 2017-05-29 ENCOUNTER — Ambulatory Visit (INDEPENDENT_AMBULATORY_CARE_PROVIDER_SITE_OTHER): Payer: Medicare Other | Admitting: Obstetrics and Gynecology

## 2017-05-29 VITALS — BP 105/67 | HR 105 | Ht 65.0 in | Wt 227.5 lb

## 2017-05-29 DIAGNOSIS — N952 Postmenopausal atrophic vaginitis: Secondary | ICD-10-CM | POA: Diagnosis not present

## 2017-05-29 DIAGNOSIS — Z780 Asymptomatic menopausal state: Secondary | ICD-10-CM

## 2017-05-29 DIAGNOSIS — Z90710 Acquired absence of both cervix and uterus: Secondary | ICD-10-CM | POA: Diagnosis not present

## 2017-05-29 DIAGNOSIS — N941 Unspecified dyspareunia: Secondary | ICD-10-CM

## 2017-05-29 NOTE — Patient Instructions (Signed)
1.  Continue with estradiol 1 mg daily orally 2.  Continue with Vagifem 10 mcg pellets twice weekly intravaginal 3.  Continue with physical therapy management of pelvic pain 4.  Return in 3 months for follow-up

## 2017-05-29 NOTE — Progress Notes (Signed)
Chief complaint: 1.  Dyspareunia 2.  Vaginal atrophy 3.  Chronic pelvic pain 4.  History of TAH/BSO in 1998; history of anterior/posterior colporrhaphy and pubovaginal sling placement  Patient presents for 8-week follow-up.  She has been started on Estrace 1 mg orally daily and Vagifem 10 mcg intravaginally twice weekly for management of vaginal atrophy and dyspareunia.  The patient has been working with physical therapy and has noted some subtle improvement in symptomatology.  She states that her physical therapist has identified trigger points for her dyspareunia. The patient is not experiencing any urinary incontinence or voiding dysfunction.  Bowel movements are normal.  Past Medical History:  Diagnosis Date  . Arthritis   . Heartburn   . Hyperlipidemia   . Hypertension   . Hypothyroidism    Past Surgical History:  Procedure Laterality Date  . ABDOMINAL HYSTERECTOMY    . ABDOMINAL HYSTERECTOMY     total- painful periods  . CHOLECYSTECTOMY  1980  . INCONTINENCE SURGERY  2012   also cystocele and rectocele  . leg vein surgery Right 06/2016  . NASAL SINUS SURGERY  2002  . REPLACEMENT TOTAL KNEE Left 2012  . THYROID SURGERY  2002  . ULNAR NERVE TRANSPOSITION Left 2007    OBJECTIVE: BP 105/67   Pulse (!) 105   Ht 5\' 5"  (1.651 m)   Wt 227 lb 8 oz (103.2 kg)   BMI 37.86 kg/m  Pleasant female no acute distress.  Alert and oriented. Abdomen: Soft, nontender without organomegaly Pelvic exam: External genitalia-normal BUS-normal Vagina-good vaginal vault support; good estrogen effect; palpation of the anterior vagina is notable for tenderness distally in the region of the sub pubic region where the pubovaginal sling was placed.  Tenderness is more notable left greater than right without an obvious palpable scar or erosion of graft being present Cervix-surgically absent Uterus-surgically absent Adnexa-surgically absent Rectovaginal-normal external exam  ASSESSMENT: 1.   Vaginal atrophy, improving 2.  Dyspareunia, likely related to the palpable tenderness in the distal vagina anteriorly under the pubic ramus in the pubovaginal sling location.  There is no obvious palpable graft or erosion. 3.  Some possible improvement in symptomatology with physical therapy  PLAN: 1.  Continue with estradiol 1 mg daily 2.  Continue with Vagifem 10 mcg tablets intravaginally twice weekly 3.  Continue with physical therapy and treatment 4.  Return in 3 months for follow-up  A total of 15 minutes were spent face-to-face with the patient during this encounter and over half of that time dealt with counseling and coordination of care.  Herold HarmsMartin A Stefhanie Kachmar, MD  Note: This dictation was prepared with Dragon dictation along with smaller phrase technology. Any transcriptional errors that result from this process are unintentional.

## 2017-06-07 ENCOUNTER — Ambulatory Visit: Payer: Medicare Other | Admitting: Physical Therapy

## 2017-06-07 DIAGNOSIS — M6281 Muscle weakness (generalized): Secondary | ICD-10-CM

## 2017-06-07 DIAGNOSIS — R29898 Other symptoms and signs involving the musculoskeletal system: Secondary | ICD-10-CM | POA: Diagnosis not present

## 2017-06-07 DIAGNOSIS — R2689 Other abnormalities of gait and mobility: Secondary | ICD-10-CM

## 2017-06-07 NOTE — Therapy (Signed)
[place] [facility] MAIN [facility] SERVICES [address] [place], [place], [address] Phone: [phone]   Fax:  [phone]  Physical Therapy Treatment  Patient Details  Name: Analilia Geddis MRN: [id number] Date of Birth: 20-Aug-1950 Referring Provider: Michiel Cowboy   Encounter Date: 06/07/2017  PT End of Session - 06/07/17 1714    Visit Number  4    Number of Visits  12    Date for PT Re-Evaluation  07/25/17    Authorization Type  g-codes     PT Start Time  1110    PT Stop Time  1210    PT Time Calculation (min)  60 min    Activity Tolerance  Patient tolerated treatment well;No increased pain    Behavior During Therapy  WFL for tasks assessed/performed       Past Medical History:  Diagnosis Date  . Arthritis   . Heartburn   . Hyperlipidemia   . Hypertension   . Hypothyroidism     Past Surgical History:  Procedure Laterality Date  . ABDOMINAL HYSTERECTOMY    . ABDOMINAL HYSTERECTOMY     total- painful periods  . CHOLECYSTECTOMY  1980  . INCONTINENCE SURGERY  2012   also cystocele and rectocele  . leg vein surgery Right 06/2016  . NASAL SINUS SURGERY  2002  . REPLACEMENT TOTAL KNEE Left 2012  . THYROID SURGERY  2002  . ULNAR NERVE TRANSPOSITION Left 2007    There were no vitals filed for this visit.  Subjective Assessment - 06/07/17 1113    Subjective  Pt has been bothering her some when cooking thansgiving dinner, shopping. Pt was able to tolerate gynecological exam with the insertion of a speculum without pain .     Pertinent History  Abdominal hysterectomy and first bladder tack in 1997 due to painful periods. Lost 10 lb since Feb , L TKA     Patient Stated Goals  decrease pelvic pain, lose weight          [facility] PT Assessment - 06/07/17 1156      Observation/Other Assessments   Observations  poor knee alignment in bridging.  breatholding / pelvic floor dyscoordination with simulated pushing of arms like on chest press machine.                  Pelvic Floor Special Questions - 06/07/17 1155    Pelvic Floor Internal Exam  pt consented verbally without contraindications after being explained details to the exam     Exam Type  Vaginal    Palpation  increased tension at L piriformis ( decreased post Tx). decreased activation of L muscles in contraction Grade 4/5 . Circular contraction post Tx     Strength  good squeeze, good lift, able to hold agaisnt strong resistance more activation and sequentially post Tx     Strength # of reps  10    Strength # of seconds  1 proper lengthening immediately to contraction                 PT Education - 06/07/17 1714    Education provided  Yes    Education Details  HEP    Person(s) Educated  Patient    Methods  Explanation;Demonstration;Tactile cues;Verbal cues    Comprehension  Returned demonstration;Verbalized understanding          PT Long Term Goals - 06/07/17 1121      PT LONG TERM GOAL #1   Title  Pt will  decrease her PDI score from % to <  % in order to improve ADLs and QOL     Time  12    Period  Weeks    Status  On-going      PT LONG TERM GOAL #2   Title  Pt will report decreased back pain from 8/10 to < 2/10 with standing and cooking for 2 hours in order to feed her family.  ( 11/29: 6-7/10)     Time  8    Period  Weeks    Status  On-going      PT LONG TERM GOAL #3   Title  Pt will demo increased L hip flex strength from 4-/5 to 4/5 in order to improve gait and aerobic fitness      Time  12    Period  Weeks    Status  Achieved      PT LONG TERM GOAL #4   Title  Pt will demo proper body mechanics with functional activities and exercises to minmize strain on pelvic floor and back and promote fitness    Time  10    Period  Weeks    Status  On-going      PT LONG TERM GOAL #5   Title  Pt will demo proper pelvic floor ROM and coordination in order to minimize pelvic pain and tolerate pelvic exams    Time  4    Period  Weeks    Status   Achieved            Plan - 06/07/17 1715    Clinical Impression Statement Pt demonstrated no tenderness at previous tender spot  ( L pubic tubercle). Only tensions notd was at L piriformis which decreased post Tx. Pt required manual Tx to also activate L pelvic floor mm for a more circumferential  contraction which she demo'd post Tx. Pt was educated on co-activation of deep core with bridging and simulated weight lifting exercises in order to avoid dyscoordination of pelvic floor which is associated with overactivity of her pelvic floor mm. Pt also was educated on functional positions for minimizing pelvic floor overactivity. Pt demo'd understanding with correct form. Pt will require more thoracolumbar/ posterior kinetic chain and lumbar mm strengthening at next session to address her back pain. Pt continues to benefit from skilled PT.     Rehab Potential  Good    PT Frequency  1x / week    PT Duration  12 weeks    PT Treatment/Interventions  Manual techniques;Neuromuscular re-education;Functional mobility training;Moist Heat;Therapeutic exercise;Therapeutic activities;Patient/family education;Energy conservation;Taping;Aquatic Therapy;Scar mobilization;Balance training;Stair training;Gait training    Consulted and Agree with Plan of Care  Patient       Patient will benefit from skilled therapeutic intervention in order to improve the following deficits and impairments:  Pain, Improper body mechanics, Increased fascial restricitons, Decreased mobility, Decreased coordination, Decreased endurance, Decreased range of motion, Decreased strength, Decreased activity tolerance, Decreased safety awareness, Obesity, Hypomobility, Increased muscle spasms, Abnormal gait, Postural dysfunction, Decreased scar mobility, Difficulty walking  Visit Diagnosis: Other abnormalities of gait and mobility  Muscle weakness (generalized)  Other symptoms and signs involving the musculoskeletal  system     Problem List Patient Active Problem List   Diagnosis Date Noted  . Vaginismus 03/08/2017  . Menopause 03/08/2017  . Status post hysterectomy 03/08/2017  . Vaginal atrophy 03/08/2017  . Female dyspareunia 03/08/2017  . Varicose veins of both lower extremities with pain 06/15/2016  . Chronic venous insufficiency  06/15/2016  . Pain in limb 06/15/2016    Mariane Masters ,PT, DPT, E-RYT  06/07/2017, 5:19 PM  [facility] MAIN [facility] SERVICES [address] [place], [place], [address] Phone: [phone]   Fax:  [phone]  Name: Eurika Sandy MRN: [id number] Date of Birth: 1950/09/02

## 2017-06-07 NOTE — Patient Instructions (Signed)
   Bridging  Position:  Elbows bent, knees hip width apart, heels under knees,   Inhale , do nothing, exhale, lift buttocks up without wobblyness of pelvis while pressing arms and shoulders, feet into floor/ bed   10 x 2 sets   _____   Pelvic floor coordination and relaxation In functional positions ( sidelying) or ( on back with pillow under hips, knees in happy baby pose)   _____ Information on physiology of anatomy/ physiologu of pelvic floor muscles.

## 2017-06-21 ENCOUNTER — Ambulatory Visit: Payer: Medicare Other | Attending: Urology | Admitting: Physical Therapy

## 2017-06-21 DIAGNOSIS — M791 Myalgia, unspecified site: Secondary | ICD-10-CM | POA: Insufficient documentation

## 2017-06-21 DIAGNOSIS — R2689 Other abnormalities of gait and mobility: Secondary | ICD-10-CM | POA: Diagnosis not present

## 2017-06-21 DIAGNOSIS — R29898 Other symptoms and signs involving the musculoskeletal system: Secondary | ICD-10-CM | POA: Diagnosis not present

## 2017-06-21 DIAGNOSIS — M6281 Muscle weakness (generalized): Secondary | ICD-10-CM | POA: Diagnosis not present

## 2017-06-22 NOTE — Patient Instructions (Addendum)
Avoid using laptop on couch with leg propped up on ottoman  Instead set up a work station with the suggested recommendations and follow the picture for guidelines on alignment and laptop set up ( 3 ring binder under laptop to raise screen to eye level)    Set up sewing machine in place in your home that will bring you joy to work your craft Gratitude journal and mark on calendar positive achievements with PT Imagery  [email or web address] in [place] is a resource if you may need to utilize professional psychotherapy support  Neuroscience of pain packet to read by Duane LopeAdrian Louw, PT, PhD

## 2017-06-22 NOTE — Therapy (Addendum)
[place] [facility] [address] [place], [place], [address] Phone: [phone]   Fax:  [phone]  Physical Therapy Treatment  Patient Details  Name: Holly Lam MRN: [id number] Date of Birth: 03/21/1951 Referring Provider: Michiel CowboyShannon McGowan   Encounter Date: 06/21/2017  PT End of Session - 06/22/17 2006    Visit Number  5    Number of Visits  12    Date for PT Re-Evaluation  07/25/17    Authorization Type  g-codes     PT Start Time  1100    PT Stop Time  1204    PT Time Calculation (min)  64 min    Activity Tolerance  Patient tolerated treatment well;No increased pain    Behavior During Therapy  WFL for tasks assessed/performed       Past Medical History:  Diagnosis Date  . Arthritis   . Heartburn   . Hyperlipidemia   . Hypertension   . Hypothyroidism     Past Surgical History:  Procedure Laterality Date  . ABDOMINAL HYSTERECTOMY    . ABDOMINAL HYSTERECTOMY     total- painful periods  . CHOLECYSTECTOMY  1980  . INCONTINENCE SURGERY  2012   also cystocele and rectocele  . leg vein surgery Right 06/2016  . NASAL SINUS SURGERY  2002  . REPLACEMENT TOTAL KNEE Left 2012  . THYROID SURGERY  2002  . ULNAR NERVE TRANSPOSITION Left 2007    There were no vitals filed for this visit.      [facility] - 06/22/17 2000      Observation/Other Assessments   Observations  pt was tearful during session as she expressed the frustration related to her Sx                           PT Education - 06/22/17 2005    Education provided  Yes    Education Details  HEP    Person(s) Educated  Patient    Methods  Explanation;Demonstration;Tactile cues;Verbal cues    Comprehension  Verbalized understanding;Returned demonstration;Verbal cues required;Tactile cues required          PT Long Term Goals - 06/07/17 1121      PT LONG TERM GOAL #1   Title  Pt will decrease her PDI score from 29% to < 24  % in order to improve ADLs and QOL     Time  12    Period  Weeks    Status  On-going      PT LONG TERM GOAL #2   Title  Pt will report decreased back pain from 8/10 to < 2/10 with standing and cooking for 2 hours in order to feed her family.  ( 11/29: 6-7/10)     Time  8    Period  Weeks    Status  On-going      PT LONG TERM GOAL #3   Title  Pt will demo increased L hip flex strength from 4-/5 to 4/5 in order to improve gait and aerobic fitness      Time  12    Period  Weeks    Status  Achieved      PT LONG TERM GOAL #4   Title  Pt will demo proper body mechanics with functional activities and exercises to minmize strain on pelvic Lam and back and promote fitness    Time  10    Period  Weeks  Status  On-going      PT LONG TERM GOAL #5   Title  Pt will demo proper pelvic Lam ROM and coordination in order to minimize pelvic pain and tolerate pelvic exams    Time  4    Period  Weeks    Status  Achieved      Additional Long Term Goals   Additional Long Term Goals  Yes      PT LONG TERM GOAL #6   Title  Pt will decrease her ODI score from 30% to < 15% in order to improve her ADLs and functional mobility    Time  12    Period  Weeks    Status  New    Target Date  08/31/17            Plan - 06/22/17 2006    Clinical Impression Statement  Pt required biopsychosocial approaches today as pt was tearful while expressing the frustration she has felt re: her Sx. Pt was provided active listening, motivational interviewing as she expressed the stress factors in her life. Pt declined resources for psychotherapy support. Pt expressed relief in knowing that the pelvic pain she had felt was confirmed by her gynecologist and PT's assessments. Pt voiced understanding to recommendations for work station at home in order to minimize increased pelvic Lam spasms. Pt also was provided biopsychosocial strategies to retrain pain and was given Duane LopeAdrian Louw, PhD, PT's neuroscience of pain  information.  Pt also was no longer tearful at the end of the session. Plan to address pt's lower kinetic chain and deficits related to her TKA at next sessions. Pt continues to benefit from skilled PT.      Clinical Presentation  Evolving    Rehab Potential  Good    PT Frequency  1x / week    PT Duration  12 weeks    PT Treatment/Interventions  Manual techniques;Neuromuscular re-education;Functional mobility training;Moist Heat;Therapeutic exercise;Therapeutic activities;Patient/family education;Energy conservation;Taping;Aquatic Therapy;Scar mobilization;Balance training;Stair training;Gait training    Consulted and Agree with Plan of Care  Patient       Patient will benefit from skilled therapeutic intervention in order to improve the following deficits and impairments:  Pain, Improper body mechanics, Increased fascial restricitons, Decreased mobility, Decreased coordination, Decreased endurance, Decreased range of motion, Decreased strength, Decreased activity tolerance, Decreased safety awareness, Obesity, Hypomobility, Increased muscle spasms, Abnormal gait, Postural dysfunction, Decreased scar mobility, Difficulty walking  Visit Diagnosis: Myalgia     Problem List Patient Active Problem List   Diagnosis Date Noted  . Vaginismus 03/08/2017  . Menopause 03/08/2017  . Status post hysterectomy 03/08/2017  . Vaginal atrophy 03/08/2017  . Female dyspareunia 03/08/2017  . Varicose veins of both lower extremities with pain 06/15/2016  . Chronic venous insufficiency 06/15/2016  . Pain in limb 06/15/2016    Mariane MastersYeung,Shin Yiing ,PT, DPT, E-RYT  06/22/2017, 8:11 PM  [place] [facility] [address] [place], [place], [address] Phone: [phone]   Fax:  [phone]  Name: Holly Lam MRN: [id number] Date of Birth: 11/10/1950

## 2017-06-25 ENCOUNTER — Ambulatory Visit
Admission: RE | Admit: 2017-06-25 | Discharge: 2017-06-25 | Disposition: A | Discharge status: Home / Self Care | Payer: Medicare Other | Source: Ambulatory Visit | Attending: Family Medicine | Admitting: Family Medicine

## 2017-06-25 DIAGNOSIS — Z1231 Encounter for screening mammogram for malignant neoplasm of breast: Secondary | ICD-10-CM | POA: Diagnosis not present

## 2017-07-02 DIAGNOSIS — M25362 Other instability, left knee: Secondary | ICD-10-CM | POA: Insufficient documentation

## 2017-07-02 DIAGNOSIS — M1711 Unilateral primary osteoarthritis, right knee: Secondary | ICD-10-CM | POA: Diagnosis not present

## 2017-07-02 DIAGNOSIS — M25569 Pain in unspecified knee: Secondary | ICD-10-CM | POA: Insufficient documentation

## 2017-07-02 DIAGNOSIS — G8929 Other chronic pain: Secondary | ICD-10-CM | POA: Diagnosis not present

## 2017-07-02 DIAGNOSIS — Z96652 Presence of left artificial knee joint: Secondary | ICD-10-CM | POA: Diagnosis not present

## 2017-07-02 DIAGNOSIS — M79606 Pain in leg, unspecified: Secondary | ICD-10-CM | POA: Diagnosis not present

## 2017-07-02 DIAGNOSIS — M25562 Pain in left knee: Secondary | ICD-10-CM | POA: Diagnosis not present

## 2017-07-05 ENCOUNTER — Ambulatory Visit: Payer: Medicare Other | Admitting: Physical Therapy

## 2017-07-05 DIAGNOSIS — M6281 Muscle weakness (generalized): Secondary | ICD-10-CM | POA: Diagnosis not present

## 2017-07-05 DIAGNOSIS — R29898 Other symptoms and signs involving the musculoskeletal system: Secondary | ICD-10-CM | POA: Diagnosis not present

## 2017-07-05 DIAGNOSIS — M791 Myalgia, unspecified site: Secondary | ICD-10-CM | POA: Diagnosis not present

## 2017-07-05 DIAGNOSIS — R2689 Other abnormalities of gait and mobility: Secondary | ICD-10-CM | POA: Diagnosis not present

## 2017-07-05 NOTE — Patient Instructions (Signed)
Reviewed functional positions  Focus on movement of pelvic floor for coordination/ blood flow with coordinated breathing

## 2017-07-05 NOTE — Therapy (Signed)
[facility] MAIN [facility] [address] [place], [place], [address] Phone: [phone]   Fax:  [phone]  Physical Therapy Treatment  Patient Details  Name: Holly FloorRobin Lam MRN: [id number] Date of Birth: 03/19/1951 Referring Provider: Michiel CowboyShannon McGowan   Encounter Date: 07/05/2017  PT End of Session - 07/05/17 1156    Visit Number  6    Number of Visits  12    Date for PT Re-Evaluation  07/25/17    Authorization Type  g-codes     PT Start Time  1110    PT Stop Time  1145    PT Time Calculation (min)  35 min    Activity Tolerance  Patient tolerated treatment well;No increased pain    Behavior During Therapy  WFL for tasks assessed/performed       Past Medical History:  Diagnosis Date  . Arthritis   . Heartburn   . Hyperlipidemia   . Hypertension   . Hypothyroidism     Past Surgical History:  Procedure Laterality Date  . ABDOMINAL HYSTERECTOMY    . ABDOMINAL HYSTERECTOMY     total- painful periods  . CHOLECYSTECTOMY  1980  . INCONTINENCE SURGERY  2012   also cystocele and rectocele  . leg vein surgery Right 06/2016  . NASAL SINUS SURGERY  2002  . REPLACEMENT TOTAL KNEE Left 2012  . THYROID SURGERY  2002  . ULNAR NERVE TRANSPOSITION Left 2007    There were no vitals filed for this visit.  Subjective Assessment - 07/05/17 1112    Subjective  Pt reports she saw her orthopedist who has informed her that there is a possibilty of MCL tear in her L knee and she will undergo an US in 2 weeks. Pt received a shot in her R knee which has helped decrease her pain.  Pt reports her pelvic pain has decreased by 50%.    (Pended)     Pertinent History  Abdominal hysterectomy and first bladder tack in 1997 due to painful periods. Lost 10 lb since Feb , L TKA     Patient Stated Goals  decrease pelvic pain, lose weight                    Pelvic Lam Special Questions - 07/05/17 1154    Pelvic Lam Internal Exam  pt consented  verbally without contraindications after being explained details to the exam     Exam Type  Vaginal    Palpation  increased tenderness and tensions 10- 2 o' clock 3rd layer,     Strength  good squeeze, good lift, able to hold agaisnt strong resistance more activation and sequentially post Tx         [facility] Adult [facility] Treatment/Exercise - 07/05/17 1155      Neuro Re-ed    Neuro Re-ed Details   see pt instructions ( body scan )       Manual Therapy   Internal Pelvic Lam  thiele massage at problem areas indicated in assessment              PT Education - 07/05/17 1156    Education provided  Yes    Education Details  HEP    Person(s) Educated  Patient    Methods  Explanation;Demonstration;Tactile cues;Verbal cues;Handout    Comprehension  Returned demonstration;Verbalized understanding          PT Long Term Goals - 07/05/17 1115      PT LONG TERM GOAL #  1   Title  Pt will decrease her PDI score from 29% to < 24 % in order to improve ADLs and QOL     Time  12    Period  Weeks    Status  On-going      PT LONG TERM GOAL #2   Title  Pt will report decreased back pain from 8/10 to < 2/10 with standing and cooking for 2 hours in order to feed her family.  ( 11/29: 6-7/10)     Time  8    Period  Weeks    Status  On-going      PT LONG TERM GOAL #3   Title  Pt will demo increased L hip flex strength from 4-/5 to 4/5 in order to improve gait and aerobic fitness      Time  12    Period  Weeks    Status  Achieved      PT LONG TERM GOAL #4   Title  Pt will demo proper body mechanics with functional activities and exercises to minmize strain on pelvic Lam and back and promote fitness    Time  10    Period  Weeks    Status  On-going      PT LONG TERM GOAL #5   Title  Pt will demo proper pelvic Lam ROM and coordination in order to minimize pelvic pain and tolerate pelvic exams    Time  4    Period  Weeks    Status  Achieved      PT LONG TERM GOAL #6   Title  Pt will  decrease her ODI score from 30% to < 15% in order to improve her ADLs and functional mobility    Time  12    Period  Weeks    Status  New            Plan - 07/05/17 1157    Clinical Impression Statement  Pt is progressing well with report of 50% less pelvic pain. Pt showed decreased tenderess and tensions at deepest layer of pelvic Lam mm at the anterior portions where she typical reports pain. Pt was educated on functional positions with pelvic Lam coordination and voiced understanding. Pt continues to benefit from skilled PT.     Rehab Potential  Good    PT Frequency  1x / week    PT Duration  12 weeks    PT Treatment/Interventions  Manual techniques;Neuromuscular re-education;Functional mobility training;Moist Heat;Therapeutic exercise;Therapeutic activities;Patient/family education;Energy conservation;Taping;Aquatic Therapy;Scar mobilization;Balance training;Stair training;Gait training    Consulted and Agree with Plan of Care  Patient       Patient will benefit from skilled therapeutic intervention in order to improve the following deficits and impairments:  Pain, Improper body mechanics, Increased fascial restricitons, Decreased mobility, Decreased coordination, Decreased endurance, Decreased range of motion, Decreased strength, Decreased activity tolerance, Decreased safety awareness, Obesity, Hypomobility, Increased muscle spasms, Abnormal gait, Postural dysfunction, Decreased scar mobility, Difficulty walking  Visit Diagnosis: Other abnormalities of gait and mobility  Muscle weakness (generalized)  Other symptoms and signs involving the musculoskeletal system     Problem List Patient Active Problem List   Diagnosis Date Noted  . Vaginismus 03/08/2017  . Menopause 03/08/2017  . Status post hysterectomy 03/08/2017  . Vaginal atrophy 03/08/2017  . Female dyspareunia 03/08/2017  . Varicose veins of both lower extremities with pain 06/15/2016  . Chronic venous  insufficiency 06/15/2016  . Pain in limb 06/15/2016    Lennie Muckle  Charna ElizabethYiing ,PT, DPT, E-RYT  07/05/2017, 11:58 AM   [facility] MAIN [facility] [address] [place], [place], [address] Phone: [phone]   Fax:  [phone]  Name: Holly Lam MRN: [id number] Date of Birth: 10/01/1950

## 2017-07-19 DIAGNOSIS — M25362 Other instability, left knee: Secondary | ICD-10-CM | POA: Diagnosis not present

## 2017-07-19 DIAGNOSIS — M25562 Pain in left knee: Secondary | ICD-10-CM | POA: Diagnosis not present

## 2017-07-19 DIAGNOSIS — Z96652 Presence of left artificial knee joint: Secondary | ICD-10-CM | POA: Diagnosis not present

## 2017-07-19 DIAGNOSIS — G8929 Other chronic pain: Secondary | ICD-10-CM | POA: Diagnosis not present

## 2017-07-20 ENCOUNTER — Ambulatory Visit: Payer: Medicare Other | Admitting: Physical Therapy

## 2017-07-31 DIAGNOSIS — Z96652 Presence of left artificial knee joint: Secondary | ICD-10-CM | POA: Diagnosis not present

## 2017-07-31 DIAGNOSIS — G8929 Other chronic pain: Secondary | ICD-10-CM | POA: Diagnosis not present

## 2017-07-31 DIAGNOSIS — M25562 Pain in left knee: Secondary | ICD-10-CM | POA: Diagnosis not present

## 2017-07-31 DIAGNOSIS — M25362 Other instability, left knee: Secondary | ICD-10-CM | POA: Diagnosis not present

## 2017-08-06 ENCOUNTER — Ambulatory Visit: Payer: Medicare Other | Admitting: Physical Therapy

## 2017-08-06 DIAGNOSIS — Z96652 Presence of left artificial knee joint: Secondary | ICD-10-CM | POA: Diagnosis not present

## 2017-08-06 DIAGNOSIS — M25362 Other instability, left knee: Secondary | ICD-10-CM | POA: Diagnosis not present

## 2017-08-23 ENCOUNTER — Ambulatory Visit: Payer: Medicare Other | Admitting: Physical Therapy

## 2017-08-23 DIAGNOSIS — J0100 Acute maxillary sinusitis, unspecified: Secondary | ICD-10-CM | POA: Diagnosis not present

## 2017-08-30 ENCOUNTER — Encounter: Payer: Medicare Other | Admitting: Obstetrics and Gynecology

## 2017-09-04 ENCOUNTER — Ambulatory Visit (INDEPENDENT_AMBULATORY_CARE_PROVIDER_SITE_OTHER): Payer: Medicare Other | Admitting: Obstetrics and Gynecology

## 2017-09-04 ENCOUNTER — Encounter: Payer: Self-pay | Admitting: Obstetrics and Gynecology

## 2017-09-04 VITALS — BP 117/74 | HR 112 | Ht 65.0 in | Wt 224.0 lb

## 2017-09-04 DIAGNOSIS — N952 Postmenopausal atrophic vaginitis: Secondary | ICD-10-CM

## 2017-09-04 DIAGNOSIS — Z09 Encounter for follow-up examination after completed treatment for conditions other than malignant neoplasm: Secondary | ICD-10-CM | POA: Diagnosis not present

## 2017-09-04 DIAGNOSIS — N942 Vaginismus: Secondary | ICD-10-CM | POA: Diagnosis not present

## 2017-09-04 DIAGNOSIS — Z90722 Acquired absence of ovaries, bilateral: Secondary | ICD-10-CM | POA: Diagnosis not present

## 2017-09-04 DIAGNOSIS — Z90710 Acquired absence of both cervix and uterus: Secondary | ICD-10-CM | POA: Diagnosis not present

## 2017-09-04 DIAGNOSIS — Z9079 Acquired absence of other genital organ(s): Secondary | ICD-10-CM | POA: Diagnosis not present

## 2017-09-04 DIAGNOSIS — N941 Unspecified dyspareunia: Secondary | ICD-10-CM

## 2017-09-04 NOTE — Patient Instructions (Signed)
1.  Continue estradiol 1 mg daily 2.  Continue Vagifem 10 mcg twice weekly intravaginal 3.  Continue with physical therapy until course of treatment is completed 4.  Return in 6 months for follow-up or sooner if needed.

## 2017-09-04 NOTE — Progress Notes (Signed)
Chief complaint: 1.  Dyspareunia 2.  Vaginal atrophy  Holly Lam presents today for 5547-month follow-up.  She is using estradiol 1 mg daily and Vagifem 10 mcg twice weekly intravaginal.  She has noted improved lubrication and less tenderness with intercourse. Holly Lam had been going to physical therapy but only made one visit in the past 3 months due to knee problems and recent flu diagnosis.  She does have several more appointments to be completed before finishing therapy.  Past Medical History:  Diagnosis Date  . Arthritis   . Heartburn   . Hyperlipidemia   . Hypertension   . Hypothyroidism   . Left knee pain    Past Surgical History:  Procedure Laterality Date  . ABDOMINAL HYSTERECTOMY    . ABDOMINAL HYSTERECTOMY     total- painful periods  . CHOLECYSTECTOMY  1980  . INCONTINENCE SURGERY  2012   also cystocele and rectocele  . leg vein surgery Right 06/2016  . NASAL SINUS SURGERY  2002  . REPLACEMENT TOTAL KNEE Left 2012  . THYROID SURGERY  2002  . ULNAR NERVE TRANSPOSITION Left 2007   OBJECTIVE: BP 117/74   Pulse (!) 112   Ht 5\' 5"  (1.651 m)   Wt 224 lb (101.6 kg)   BMI 37.28 kg/m  Pleasant female in no acute distress.  Alert and oriented. Abdomen: Soft, nontender without organomegaly Pelvic exam: External genitalia-normal BUS-normal Vagina-good estrogen effect; no significant tenderness at the introitus; palpation of the vagina reveals no obvious point tenderness or mass.  Palpation of the retropubic space did not elicit any trigger point for her pain today.  (Previously noted was 7 pubic tenderness left greater than right without obvious palpable scar or graft or erosion) Cervix-surgically absent Uterus-surgically absent Adnexa-surgically absent Bimanual-no palpable pelvic mass Rectovaginal-normal external exam  ASSESSMENT: 1.  Dyspareunia, lessening with ERT and topical vaginal estrogen therapy 2.  Vaginal atrophy, improving 3.  Ongoing physical therapy to be  completed soon  PLAN: 1.  Continue estradiol 1 mg daily 2.  Continue Vagifem 10 mcg pellets intravaginal twice weekly 3.  Complete physical therapy protocol 4.  Return in 6 months for follow-up or sooner if needed.  A total of 15 minutes were spent face-to-face with the patient during this encounter and over half of that time dealt with counseling and coordination of care.  Herold HarmsMartin A Stryker Veasey, MD  Note: This dictation was prepared with Dragon dictation along with smaller phrase technology. Any transcriptional errors that result from this process are unintentional.

## 2017-09-06 ENCOUNTER — Ambulatory Visit: Payer: Medicare Other | Attending: Urology | Admitting: Physical Therapy

## 2017-09-06 DIAGNOSIS — M6281 Muscle weakness (generalized): Secondary | ICD-10-CM

## 2017-09-06 DIAGNOSIS — R29898 Other symptoms and signs involving the musculoskeletal system: Secondary | ICD-10-CM | POA: Diagnosis not present

## 2017-09-06 DIAGNOSIS — R2689 Other abnormalities of gait and mobility: Secondary | ICD-10-CM | POA: Diagnosis not present

## 2017-09-06 NOTE — Therapy (Signed)
[place] MAIN [facility] SERVICES [address] [place], [place], [address] Phone: [phone]   Fax:  [phone]  Physical Therapy Treatment /Discharge Note  Patient Details  Name: Holly Lam MRN: [id number] Date of Birth: April 15, 1951 Referring Provider: Zara Council   Encounter Date: 09/06/2017  PT End of Session - 09/06/17 1120    Visit Number  7    Number of Visits  12    Date for PT Re-Evaluation  07/25/17    Authorization Type  g-codes     PT Start Time  September 04, 1113    PT Stop Time  1140    PT Time Calculation (min)  25 min    Activity Tolerance  Patient tolerated treatment well;No increased pain    Behavior During Therapy  WFL for tasks assessed/performed       Past Medical History:  Diagnosis Date  . Arthritis   . Heartburn   . Hyperlipidemia   . Hypertension   . Hypothyroidism   . Left knee pain     Past Surgical History:  Procedure Laterality Date  . ABDOMINAL HYSTERECTOMY    . ABDOMINAL HYSTERECTOMY     total- painful periods  . CHOLECYSTECTOMY  1980  . INCONTINENCE SURGERY  09-04-10   also cystocele and rectocele  . leg vein surgery Right 06/2016  . NASAL SINUS SURGERY  2000/09/04  . REPLACEMENT TOTAL KNEE Left 09-04-2010  . THYROID SURGERY  09/04/2000  . ULNAR NERVE TRANSPOSITION Left 2005-09-04    There were no vitals filed for this visit.  Subjective Assessment - 09/06/17 1116    Subjective  Pt consulted her ortho doctor and she is now wearing a brace on L knee for 3 months to decrease stretching of MCL. Pelvic pain is still at the same location at 2 o clock but she finds that if she modifies her positions, then the pain is managemable. Vaginal moisture has been helped by estrodial.     Pertinent History  Abdominal hysterectomy and first bladder tack in September 05, 1995 due to painful periods. Lost 10 lb since September 04, 2022 , L TKA     Patient Stated Goals  decrease pelvic pain, lose weight                       [facility] Adult PT Treatment/Exercise -  09/06/17 1945      Neuro Re-ed    Neuro Re-ed Details   reassess goals for d/c             PT Education - 09/06/17 1946    Education provided  Yes    Education Details  reasssessed goals. d/c    Person(s) Educated  Patient          PT Long Term Goals - 09/06/17 1122      PT LONG TERM GOAL #1   Title  Pt will decrease her [place] score from 29% to < 24 % in order to improve ADLs and QOL  2/28: 18%)     Time  12    Period  Weeks    Status  Achieved      PT LONG TERM GOAL #2   Title  Pt will report decreased back pain from 8/10 to < 2/10 with standing and cooking for 2 hours in order to feed her family.  ( 11/29: 6-7/10, 2/28: 3-4/10 )     Time  8    Period  Weeks    Status  Partially Met  PT LONG TERM GOAL #3   Title  Pt will demo increased L hip flex strength from 4-/5 to 4/5 in order to improve gait and aerobic fitness      Time  12    Period  Weeks    Status  Achieved      PT LONG TERM GOAL #4   Title  Pt will demo proper body mechanics with functional activities and exercises to minmize strain on pelvic floor and back and promote fitness    Time  10    Period  Weeks    Status  Achieved      PT LONG TERM GOAL #5   Title  Pt will demo proper pelvic floor ROM and coordination in order to minimize pelvic pain and tolerate pelvic exams    Time  4    Period  Weeks    Status  Achieved      PT LONG TERM GOAL #6   Title  Pt will decrease her ODI score from 30% to < 15% in order to improve her ADLs and functional mobility (2/28: 26%)     Time  12    Period  Weeks    Status  Partially Met            Plan - 09/06/17 1947    Clinical Impression Statement  Pt achieved 4/6 goals and has be able to return to her ADLs with significantly decreased pelvic and back pain. Pt's scores for [place] and ODI have decreased. Pt's pelvic floor mm tightness has also decreased as pt has regained proper ROM and coordination. Pt's deep core mm have also improved Pt's scar  restrictions have decreased. Pt is ready for d/c at this time. Pt was recommended to undergo pre/post rehab for her L knee.  Pt voiced understanding.     Rehab Potential  Good    PT Frequency  1x / week    PT Duration  12 weeks    PT Treatment/Interventions  Manual techniques;Neuromuscular re-education;Functional mobility training;Moist Heat;Therapeutic exercise;Therapeutic activities;Patient/family education;Energy conservation;Taping;Aquatic Therapy;Scar mobilization;Balance training;Stair training;Gait training    Consulted and Agree with Plan of Care  Patient       Patient will benefit from skilled therapeutic intervention in order to improve the following deficits and impairments:  Pain, Improper body mechanics, Increased fascial restricitons, Decreased mobility, Decreased coordination, Decreased endurance, Decreased range of motion, Decreased strength, Decreased activity tolerance, Decreased safety awareness, Obesity, Hypomobility, Increased muscle spasms, Abnormal gait, Postural dysfunction, Decreased scar mobility, Difficulty walking  Visit Diagnosis: Other abnormalities of gait and mobility  Muscle weakness (generalized)  Other symptoms and signs involving the musculoskeletal system     Problem List Patient Active Problem List   Diagnosis Date Noted  . Status post anterior/posterior colporrhaphy with pubovaginal sling 09/04/2017  . Vaginismus 03/08/2017  . Menopause 03/08/2017  . Status post TAH-BSO 03/08/2017  . Vaginal atrophy 03/08/2017  . Female dyspareunia 03/08/2017  . Varicose veins of both lower extremities with pain 06/15/2016  . Chronic venous insufficiency 06/15/2016  . Pain in limb 06/15/2016    Jerl Mina ,PT, DPT, E-RYT  09/06/2017, 7:49 PM  [place] MAIN [facility] SERVICES [address] [place], [place], [address] Phone: [phone]   Fax:  [phone]  Name: Holly Lam MRN: [id number] Date of Birth:  08-May-1951

## 2017-11-02 IMAGING — MG MM DIGITAL SCREENING BILAT W/ CAD
4 series · 4 of 4 positions shown · non-contrast
Comparison: Previous exam(s).

CLINICAL DATA: Screening.

EXAM:
DIGITAL SCREENING BILATERAL MAMMOGRAM WITH CAD

[R CC]
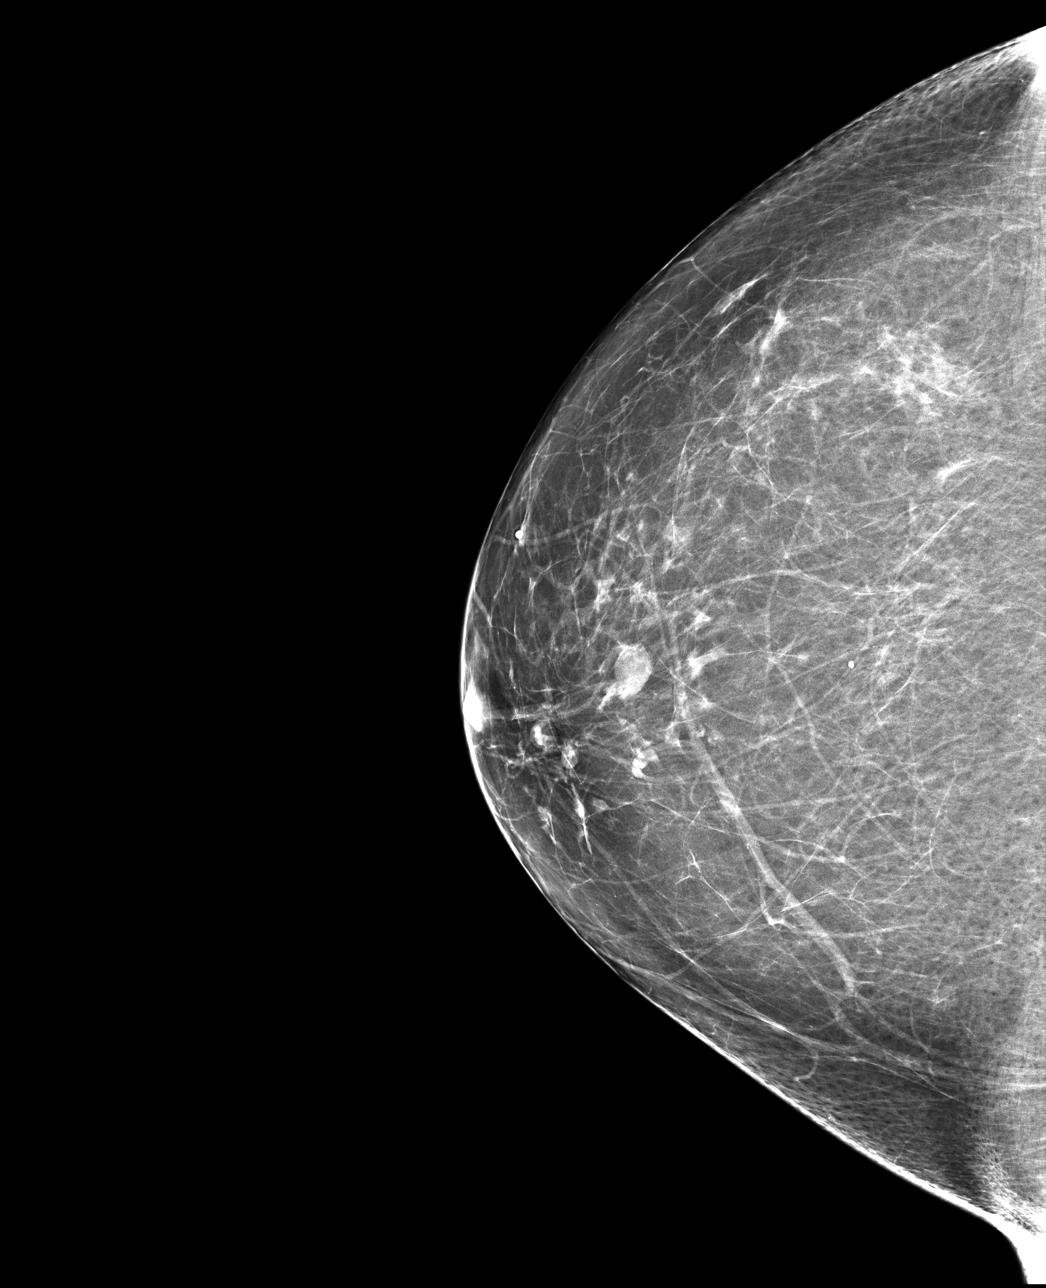

[L CC]
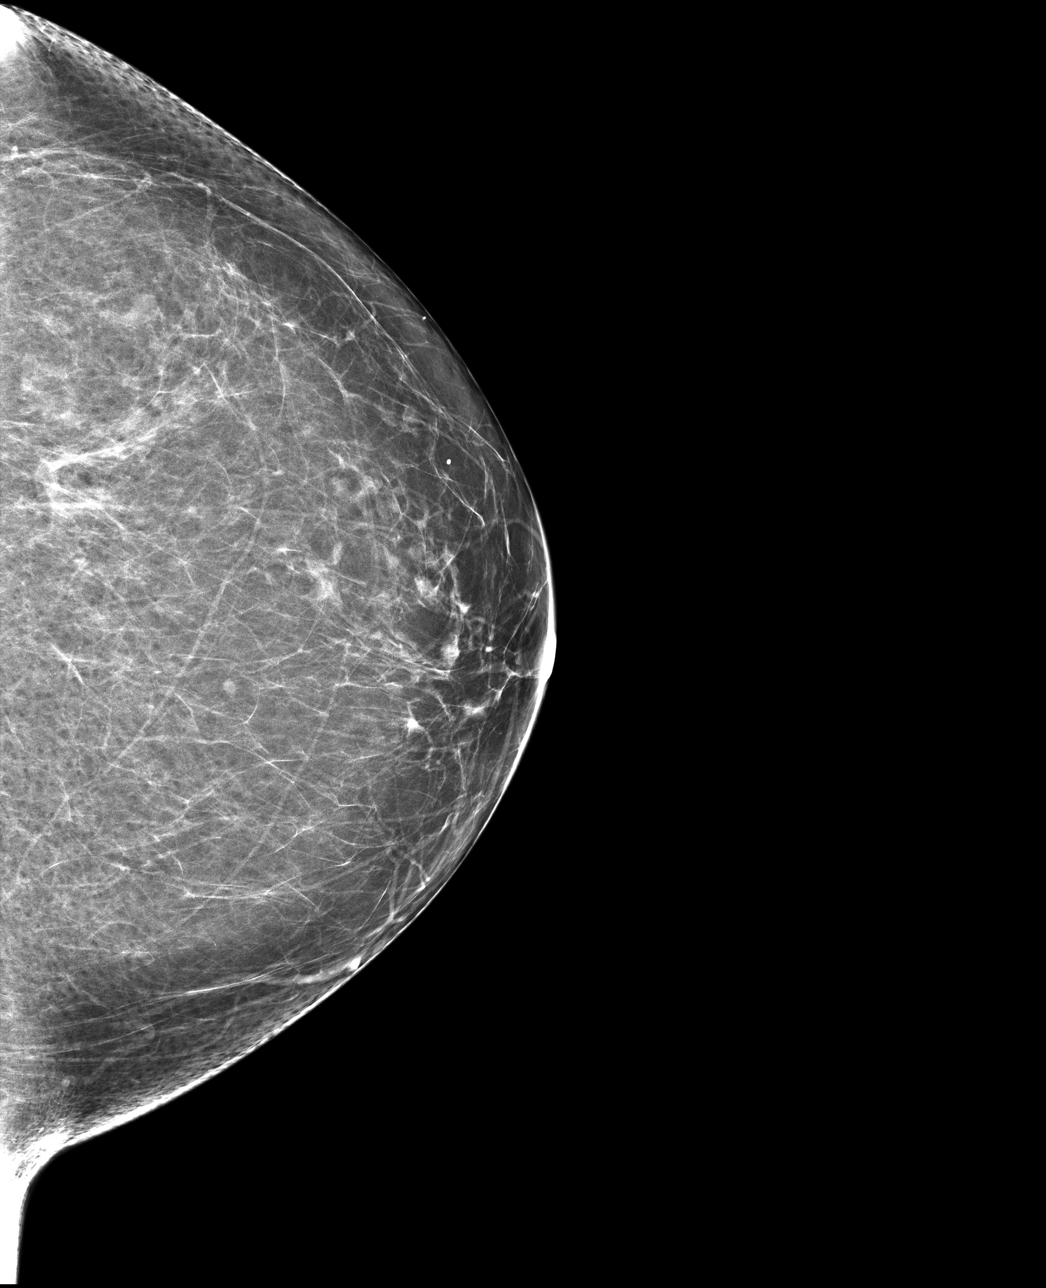

[R MLO]
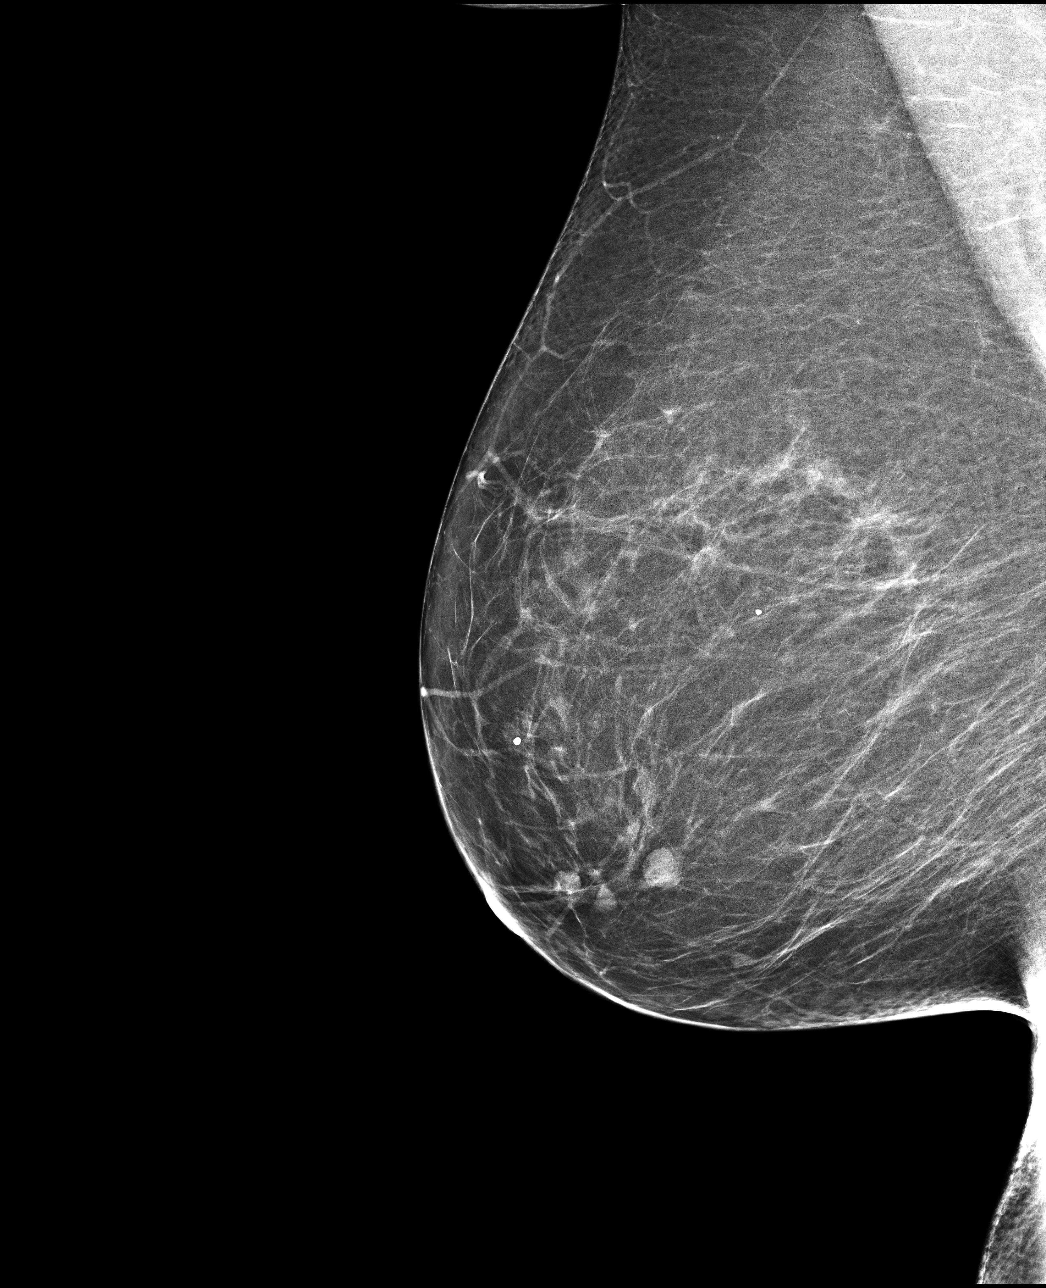

[L MLO]
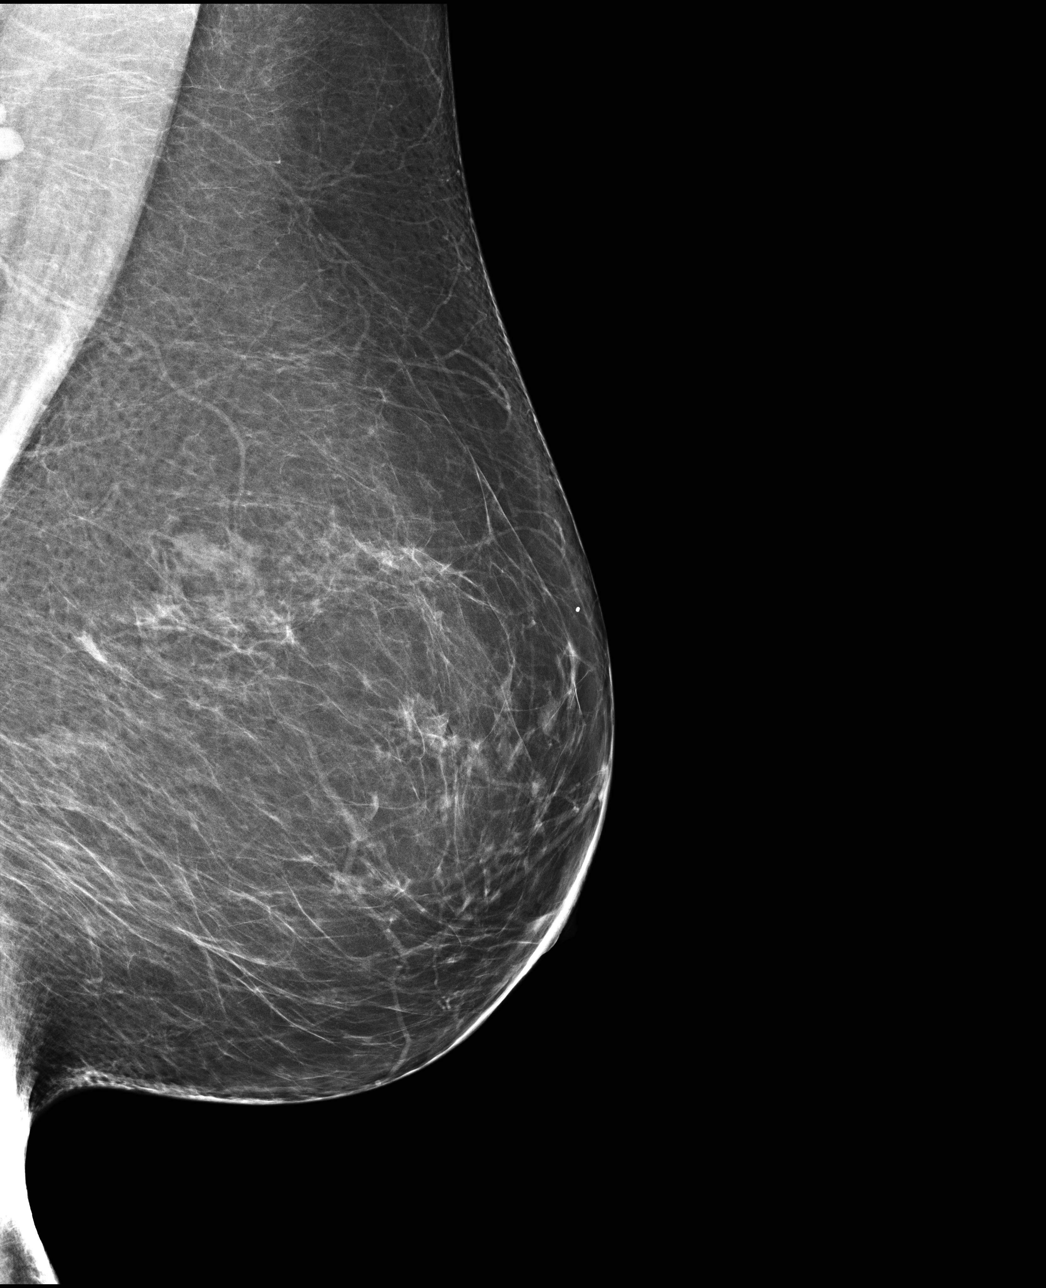

[4 of 4 positions shown; findings below may reference images not displayed]

ACR Breast Density Category b: There are scattered areas of
fibroglandular density.
FINDINGS: There are no findings suspicious for malignancy. Images were
processed with CAD.
IMPRESSION: No mammographic evidence of malignancy. A result letter of this
screening mammogram will be mailed directly to the patient.

RECOMMENDATION:
Screening mammogram in one year. (Code:AS-G-LCT)

BI-RADS CATEGORY  1: Negative.

## 2017-11-05 DIAGNOSIS — M1712 Unilateral primary osteoarthritis, left knee: Secondary | ICD-10-CM | POA: Diagnosis not present

## 2017-11-05 DIAGNOSIS — S83412D Sprain of medial collateral ligament of left knee, subsequent encounter: Secondary | ICD-10-CM | POA: Diagnosis not present

## 2017-11-05 DIAGNOSIS — M25462 Effusion, left knee: Secondary | ICD-10-CM | POA: Diagnosis not present

## 2017-11-05 DIAGNOSIS — Z96652 Presence of left artificial knee joint: Secondary | ICD-10-CM | POA: Diagnosis not present

## 2017-11-05 DIAGNOSIS — E669 Obesity, unspecified: Secondary | ICD-10-CM | POA: Insufficient documentation

## 2017-11-05 DIAGNOSIS — M25362 Other instability, left knee: Secondary | ICD-10-CM | POA: Diagnosis not present

## 2017-11-05 DIAGNOSIS — S83412A Sprain of medial collateral ligament of left knee, initial encounter: Secondary | ICD-10-CM | POA: Insufficient documentation

## 2017-11-19 DIAGNOSIS — R0789 Other chest pain: Secondary | ICD-10-CM | POA: Diagnosis not present

## 2017-11-19 DIAGNOSIS — I10 Essential (primary) hypertension: Secondary | ICD-10-CM | POA: Diagnosis not present

## 2017-11-19 DIAGNOSIS — F3342 Major depressive disorder, recurrent, in full remission: Secondary | ICD-10-CM | POA: Diagnosis not present

## 2017-11-21 DIAGNOSIS — J309 Allergic rhinitis, unspecified: Secondary | ICD-10-CM | POA: Diagnosis not present

## 2017-11-22 DIAGNOSIS — M25562 Pain in left knee: Secondary | ICD-10-CM | POA: Diagnosis not present

## 2017-11-22 DIAGNOSIS — G8929 Other chronic pain: Secondary | ICD-10-CM | POA: Diagnosis not present

## 2017-11-23 DIAGNOSIS — E669 Obesity, unspecified: Secondary | ICD-10-CM | POA: Diagnosis not present

## 2017-11-23 DIAGNOSIS — Z96653 Presence of artificial knee joint, bilateral: Secondary | ICD-10-CM | POA: Diagnosis not present

## 2017-11-23 DIAGNOSIS — S83412D Sprain of medial collateral ligament of left knee, subsequent encounter: Secondary | ICD-10-CM | POA: Diagnosis not present

## 2017-11-23 DIAGNOSIS — Z96651 Presence of right artificial knee joint: Secondary | ICD-10-CM | POA: Diagnosis not present

## 2017-11-23 DIAGNOSIS — M25362 Other instability, left knee: Secondary | ICD-10-CM | POA: Diagnosis not present

## 2017-12-30 DIAGNOSIS — S82141A Displaced bicondylar fracture of right tibia, initial encounter for closed fracture: Secondary | ICD-10-CM | POA: Insufficient documentation

## 2018-01-01 DIAGNOSIS — S83231A Complex tear of medial meniscus, current injury, right knee, initial encounter: Secondary | ICD-10-CM | POA: Diagnosis not present

## 2018-01-01 DIAGNOSIS — M25561 Pain in right knee: Secondary | ICD-10-CM | POA: Diagnosis not present

## 2018-01-01 DIAGNOSIS — S82141A Displaced bicondylar fracture of right tibia, initial encounter for closed fracture: Secondary | ICD-10-CM | POA: Diagnosis not present

## 2018-01-01 DIAGNOSIS — S82831A Other fracture of upper and lower end of right fibula, initial encounter for closed fracture: Secondary | ICD-10-CM | POA: Diagnosis not present

## 2018-01-01 DIAGNOSIS — S82144A Nondisplaced bicondylar fracture of right tibia, initial encounter for closed fracture: Secondary | ICD-10-CM | POA: Diagnosis not present

## 2018-01-04 DIAGNOSIS — S82121A Displaced fracture of lateral condyle of right tibia, initial encounter for closed fracture: Secondary | ICD-10-CM | POA: Diagnosis not present

## 2018-01-04 DIAGNOSIS — S82141A Displaced bicondylar fracture of right tibia, initial encounter for closed fracture: Secondary | ICD-10-CM | POA: Diagnosis not present

## 2018-01-05 DIAGNOSIS — S82141D Displaced bicondylar fracture of right tibia, subsequent encounter for closed fracture with routine healing: Secondary | ICD-10-CM | POA: Diagnosis not present

## 2018-01-05 DIAGNOSIS — Z01818 Encounter for other preprocedural examination: Secondary | ICD-10-CM | POA: Diagnosis not present

## 2018-01-05 DIAGNOSIS — E890 Postprocedural hypothyroidism: Secondary | ICD-10-CM | POA: Diagnosis present

## 2018-01-05 DIAGNOSIS — I10 Essential (primary) hypertension: Secondary | ICD-10-CM | POA: Diagnosis present

## 2018-01-05 DIAGNOSIS — S82141A Displaced bicondylar fracture of right tibia, initial encounter for closed fracture: Secondary | ICD-10-CM | POA: Diagnosis present

## 2018-01-05 DIAGNOSIS — Z96652 Presence of left artificial knee joint: Secondary | ICD-10-CM | POA: Diagnosis present

## 2018-01-05 DIAGNOSIS — R9431 Abnormal electrocardiogram [ECG] [EKG]: Secondary | ICD-10-CM | POA: Diagnosis present

## 2018-01-15 DIAGNOSIS — S82141A Displaced bicondylar fracture of right tibia, initial encounter for closed fracture: Secondary | ICD-10-CM | POA: Diagnosis not present

## 2018-01-23 ENCOUNTER — Encounter

## 2018-01-23 ENCOUNTER — Encounter: Payer: Self-pay | Admitting: Internal Medicine

## 2018-01-23 ENCOUNTER — Ambulatory Visit (INDEPENDENT_AMBULATORY_CARE_PROVIDER_SITE_OTHER): Payer: Medicare Other | Admitting: Internal Medicine

## 2018-01-23 VITALS — BP 126/68 | HR 72 | Ht 65.0 in | Wt 221.0 lb

## 2018-01-23 DIAGNOSIS — I10 Essential (primary) hypertension: Secondary | ICD-10-CM

## 2018-01-23 DIAGNOSIS — R0789 Other chest pain: Secondary | ICD-10-CM

## 2018-01-23 DIAGNOSIS — Z8249 Family history of ischemic heart disease and other diseases of the circulatory system: Secondary | ICD-10-CM

## 2018-01-23 MED ORDER — METOPROLOL TARTRATE 25 MG PO TABS: 12.5000 mg | ORAL_TABLET | Freq: Two times a day (BID) | ORAL | 3 refills | Status: DC

## 2018-01-23 NOTE — Patient Instructions (Signed)
Medication Instructions:  Your physician has recommended you make the following change in your medication:  1- The day before CT, START Metoprolol Tartrate 12.5 mg (0.5 tablet) by mouth two times a day.   Labwork: Your physician recommends that you return for lab work WITHIN 30 DAYS OF THE CT. (BMET). - [facility]. You will check in at the front desk to the right as you walk into the atrium. Valet Parking is offered if needed. - Do this once someone has called you to schedule the CT to ensure it is within 30 days.    Testing/Procedures: Your physician has requested that you have cardiac CT. Cardiac computed tomography (CT) is a painless test that uses an x-ray machine to take clear, detailed pictures of your heart. For further information please visit [email or web address].    Please [facility] the [facility] of [facility] at xx:xx AM (30-45 minutes prior to test start time)  [facility] [address] [place], [place] [address] [phone]  Proceed to the [facility] Radiology Department (First Floor).  Please follow these instructions carefully (unless otherwise directed):  Hold all erectile dysfunction medications at least 48 hours prior to test.  On the Night Before the Test: . Drink plenty of water. . Do not consume any caffeinated/decaffeinated beverages or chocolate 12 hours prior to your test. . Do not take any antihistamines 12 hours prior to your test.   On the Day of the Test: . Drink plenty of water. Do not drink any water within one hour of the test. . Do not eat any food 4 hours prior to the test. . You may take your regular medications prior to the test. . HOLD Furosemide morning of the test.  After the Test: . Drink plenty of water. . After receiving IV contrast, you may experience a mild flushed feeling. This is normal. . On occasion, you may experience a mild rash up to 24 hours after the test.  This is not dangerous. If this occurs, you can take Benadryl 25 mg and increase your fluid intake. . If you experience trouble breathing, this can be serious. If it is severe call 911 IMMEDIATELY. If it is mild, please call our office.  Follow-Up: Your physician recommends that you schedule a follow-up appointment in: to be determined pending results.

## 2018-01-23 NOTE — Progress Notes (Signed)
New Outpatient Visit Date: 01/23/2018  Referring Provider: Jerl Mina, MD [address] [place], [place] [address]  Chief Complaint: Chest pain and family history of atherosclerotic cardiovascular disease  HPI:  Ms. Bellissimo is a 67 y.o. female who is being seen today for the evaluation of chest pain at the request of Dr. Burnett Sheng. She has a history of hypertension, diabetes mellitus, thyroid disease, GERD, depression, and osteoarthritis.  Ms. Ayo reports that her 12 year old son died unexpectedly in 09/30/2022 from a suspected heart attack.  He called EMS due to pain in his neck/throat, back, and legs.  Shortly after EMS arrived, he became unresponsive and could not be resuscitated.  His family believes that an autopsy was performed; death certificate that Ms. Appelbaum brings along today indicates atherosclerotic heart disease as the cause of death (certified by paramedic).  Since then, Ms. Rahilly was told by her sister that their father may have also had atherosclerosis.  Around the time of her son's death, Ms. Davern had a few episodes of dull chest pain without associated symptoms, that would last ~30 minutes.  The pain was not exertional and seemed to be mostly related to stress.  She has not had any further episodes over the last 2-3 months.  She denies shortness of breath, palpitations, lightheadedness, orthopnea, and edema.  Ms. Koestner underwent a treadmill stress test while living in [place] in 2013.  She was asymptomatic at the time and was told that everything looked normal.  She has not had any further ischemia testing since then.  She is currently on Victoza for weight loss, not diabetes mellitus.  --------------------------------------------------------------------------------------------------  Cardiovascular History & Procedures: Cardiovascular Problems:  Chest pain  Risk Factors:  Hypertension, obesity, family history, and age greater than 55  Cath/PCI:  None  CV  Surgery:  None  EP Procedures and Devices:  None  Non-Invasive Evaluation(s):  Exercise tolerance test in 04/2012 in [place] that was normal per the patient's report.  Recent CV Pertinent Labs: Lab Results  Component Value Date   BUN 24 08/24/2016   CREATININE 0.73 08/24/2016    --------------------------------------------------------------------------------------------------  Past Medical History:  Diagnosis Date  . Arthritis   . Heartburn   . Hyperlipidemia   . Hypertension   . Hypothyroidism   . Left knee pain     Past Surgical History:  Procedure Laterality Date  . ABDOMINAL HYSTERECTOMY    . ABDOMINAL HYSTERECTOMY     total- painful periods  . CHOLECYSTECTOMY  1980  . INCONTINENCE SURGERY  2012   also cystocele and rectocele  . leg vein surgery Right 06/2016  . legsurgery    . NASAL SINUS SURGERY  2002  . REPLACEMENT TOTAL KNEE Left 2012  . THYROID SURGERY  2002  . ULNAR NERVE TRANSPOSITION Left 2007    Current Meds  Medication Sig  . acetaminophen (TYLENOL) 500 MG tablet Take 500 mg by mouth every 6 (six) hours as needed.  . cetirizine (ZYRTEC) 10 MG chewable tablet Chew 10 mg by mouth daily.  Marland Kitchen estradiol (ESTRACE) 1 MG tablet Take 1 tablet (1 mg total) by mouth daily.  . Estradiol 10 MCG TABS vaginal tablet Place 1 tablet (10 mcg total) vaginally at bedtime.  . fexofenadine (ALLEGRA) 180 MG tablet Take 180 mg by mouth daily.  Sherlynn Stalls Mineral Oil-Mineral Oil (RETAINE MGD) 0.5-0.5 % EMUL Apply to eye.  Marland Kitchen MEGARED OMEGA-3 KRILL OIL PO   . olmesartan (BENICAR) 40 MG tablet   .  SYNTHROID 50 MCG tablet   . venlafaxine XR (EFFEXOR-XR) 150 MG 24 hr capsule   . VICTOZA 18 MG/3ML SOPN     Allergies: Tape; Levofloxacin; Moxifloxacin; and Prednisone  Social History   Tobacco Use  . Smoking status: Never Smoker  . Smokeless tobacco: Never Used  Substance Use Topics  . Alcohol use: Yes    Comment: seldom  . Drug use: No    Family History  Problem  Relation Age of Onset  . Prostate cancer Brother   . Heart attack Brother   . Diabetes Father   . Heart attack Father   . Colon cancer Paternal Aunt   . Heart attack Son   . Stroke Mother   . Breast cancer Neg Hx   . Kidney cancer Neg Hx   . Bladder Cancer Neg Hx   . Ovarian cancer Neg Hx     Review of Systems: Patient reports limited mobility due to orthopedic issues in her legs/feet.  Otherwise, a 12-system review of systems was performed and was negative except as noted in the HPI.  --------------------------------------------------------------------------------------------------  Physical Exam: BP 126/68 (BP Location: Right Arm, Patient Position: Sitting, Cuff Size: Normal)   Pulse 72   Ht 5\' 5"  (1.651 m)   Wt 221 lb (100.2 kg)   BMI 36.78 kg/m   General:  NAD.  Accompanied by her husband. HEENT: No conjunctival pallor or scleral icterus. Moist mucous membranes. OP clear. Neck: Supple without lymphadenopathy, thyromegaly, JVD, or HJR. No carotid bruit. Lungs: Normal work of breathing. Clear to auscultation bilaterally without wheezes or crackles. Heart: Regular rate and rhythm without murmurs, rubs, or gallops. Unable to assess PMI due to body habitus. Abd: Bowel sounds present. Soft, NT/ND.  Unable to assess HSM due to body habitus. Ext: No lower extremity edema. Radial, PT, and DP pulses are 2+ bilaterally Skin: Warm and dry without rash. Neuro: CNIII-XII intact. Strength and fine-touch sensation intact in upper and lower extremities bilaterally. Psych: Normal mood and affect.  EKG:  Normal sinus rhythm with occasional PACs and borderline LVH.  Otherwise, no significant abnormalities.  No results found for: WBC, HGB, HCT, MCV, PLT  Lab Results  Component Value Date   BUN 24 08/24/2016   CREATININE 0.73 08/24/2016    No results found for: CHOL, HDL, LDLCALC, LDLDIRECT, TRIG,  CHOLHDL   --------------------------------------------------------------------------------------------------  ASSESSMENT AND PLAN: Atypical chest pain and family history of ASCVD Chest pain that occurred around the time of Ms. Creswell's son's death very well could have been due to stress.  She has not had any further episodes over the last 2-3 months.  However, she has several cardiac risk factors including hypertension, obesity, age, and family history.  She is currently dealing with orthopedic issues in her legs/feet, which prevent her from doing much walking.  We discussed further evaluation with pharmacologic stress testing, cardiac CTA, and cardiac catheterization.  We have agreed to perform a cardiac CTA, which will not only allows [place] to assess her coronary anatomy but also evaluate for aortopathy, given that her son's cause of death is not entirely certain and some of his symptoms could have also been due to aortic dissection.  We will have her begin taking metoprolol tartrate 12.5 mg BID on the day before the study.  We will otherwise not make any medication changes at this time.  Essential hypertension Blood pressure is adequately controlled.  She should continue her current medications and follow-up with Dr. Burnett Sheng.  Follow-up: To be determined  based on the results of cardiac CTA.  If normal, Ms. Adriana SimasCook can return to see us on an as-needed basis.  Yvonne Kendallhristopher Laela Deviney, MD 01/23/2018 9:46 AM

## 2018-01-24 ENCOUNTER — Ambulatory Visit: Payer: Medicare Other | Admitting: Urology

## 2018-02-06 DIAGNOSIS — Y33XXXD Other specified events, undetermined intent, subsequent encounter: Secondary | ICD-10-CM | POA: Diagnosis not present

## 2018-02-06 DIAGNOSIS — S82131A Displaced fracture of medial condyle of right tibia, initial encounter for closed fracture: Secondary | ICD-10-CM | POA: Diagnosis not present

## 2018-02-06 DIAGNOSIS — S82131D Displaced fracture of medial condyle of right tibia, subsequent encounter for closed fracture with routine healing: Secondary | ICD-10-CM | POA: Diagnosis not present

## 2018-02-06 DIAGNOSIS — M25561 Pain in right knee: Secondary | ICD-10-CM | POA: Diagnosis not present

## 2018-02-11 DIAGNOSIS — E669 Obesity, unspecified: Secondary | ICD-10-CM | POA: Diagnosis not present

## 2018-02-11 DIAGNOSIS — Z96652 Presence of left artificial knee joint: Secondary | ICD-10-CM | POA: Diagnosis not present

## 2018-02-11 DIAGNOSIS — S83412D Sprain of medial collateral ligament of left knee, subsequent encounter: Secondary | ICD-10-CM | POA: Diagnosis not present

## 2018-02-11 DIAGNOSIS — M25562 Pain in left knee: Secondary | ICD-10-CM | POA: Diagnosis not present

## 2018-02-11 DIAGNOSIS — S82141A Displaced bicondylar fracture of right tibia, initial encounter for closed fracture: Secondary | ICD-10-CM | POA: Diagnosis not present

## 2018-02-14 DIAGNOSIS — L298 Other pruritus: Secondary | ICD-10-CM | POA: Diagnosis not present

## 2018-02-27 DIAGNOSIS — M25661 Stiffness of right knee, not elsewhere classified: Secondary | ICD-10-CM | POA: Diagnosis not present

## 2018-02-27 DIAGNOSIS — M25561 Pain in right knee: Secondary | ICD-10-CM | POA: Diagnosis not present

## 2018-02-27 DIAGNOSIS — S82131A Displaced fracture of medial condyle of right tibia, initial encounter for closed fracture: Secondary | ICD-10-CM | POA: Diagnosis not present

## 2018-02-27 DIAGNOSIS — R29898 Other symptoms and signs involving the musculoskeletal system: Secondary | ICD-10-CM | POA: Diagnosis not present

## 2018-02-27 DIAGNOSIS — R269 Unspecified abnormalities of gait and mobility: Secondary | ICD-10-CM | POA: Diagnosis not present

## 2018-02-28 ENCOUNTER — Telehealth: Payer: Self-pay | Admitting: *Deleted

## 2018-02-28 NOTE — Telephone Encounter (Signed)
Patient requested a call from CT scheduler from nurse to go over per-procedural instructions. Called patient and she verbalized understanding to take metoprolol 0.5 tablet two times a day starting the day before as well as the other instructions as listed on her AVS from [facility]. She was appreciative.

## 2018-03-05 ENCOUNTER — Encounter: Payer: Medicare Other | Admitting: Obstetrics and Gynecology

## 2018-03-07 DIAGNOSIS — S82131A Displaced fracture of medial condyle of right tibia, initial encounter for closed fracture: Secondary | ICD-10-CM | POA: Diagnosis not present

## 2018-03-13 ENCOUNTER — Other Ambulatory Visit
Admission: RE | Admit: 2018-03-13 | Discharge: 2018-03-13 | Disposition: A | Discharge status: Home / Self Care | Payer: Medicare Other | Source: Ambulatory Visit | Attending: Internal Medicine | Admitting: Internal Medicine

## 2018-03-13 DIAGNOSIS — R0789 Other chest pain: Secondary | ICD-10-CM | POA: Diagnosis not present

## 2018-03-13 DIAGNOSIS — Z8249 Family history of ischemic heart disease and other diseases of the circulatory system: Secondary | ICD-10-CM | POA: Diagnosis not present

## 2018-03-13 DIAGNOSIS — I10 Essential (primary) hypertension: Secondary | ICD-10-CM | POA: Insufficient documentation

## 2018-03-13 LAB — BASIC METABOLIC PANEL
Anion gap: 8 (ref 5–15)
BUN: 15 mg/dL (ref 8–23)
CHLORIDE: 106 mmol/L (ref 98–111)
CO2: 24 mmol/L (ref 22–32)
CREATININE: 0.76 mg/dL (ref 0.44–1.00)
Calcium: 8.6 mg/dL — ABNORMAL LOW (ref 8.9–10.3)
GFR calc Af Amer: >60 mL/min (ref >60)
GFR calc non Af Amer: >60 mL/min (ref >60)
Glucose, Bld: 111 mg/dL — ABNORMAL HIGH (ref 70–99)
Potassium: 3.4 mmol/L — ABNORMAL LOW (ref 3.5–5.1)
SODIUM: 138 mmol/L (ref 135–145)

## 2018-03-14 ENCOUNTER — Other Ambulatory Visit: Payer: Self-pay | Admitting: Obstetrics and Gynecology

## 2018-03-14 DIAGNOSIS — S82131A Displaced fracture of medial condyle of right tibia, initial encounter for closed fracture: Secondary | ICD-10-CM | POA: Diagnosis not present

## 2018-03-15 DIAGNOSIS — M1711 Unilateral primary osteoarthritis, right knee: Secondary | ICD-10-CM | POA: Diagnosis not present

## 2018-03-15 DIAGNOSIS — S82141D Displaced bicondylar fracture of right tibia, subsequent encounter for closed fracture with routine healing: Secondary | ICD-10-CM | POA: Diagnosis not present

## 2018-03-21 DIAGNOSIS — S82131A Displaced fracture of medial condyle of right tibia, initial encounter for closed fracture: Secondary | ICD-10-CM | POA: Diagnosis not present

## 2018-03-22 ENCOUNTER — Ambulatory Visit (HOSPITAL_COMMUNITY)
Admission: RE | Admit: 2018-03-22 | Discharge: 2018-03-22 | Disposition: A | Discharge status: Home / Self Care | Payer: Medicare Other | Source: Ambulatory Visit | Attending: Internal Medicine | Admitting: Internal Medicine

## 2018-03-22 DIAGNOSIS — E119 Type 2 diabetes mellitus without complications: Secondary | ICD-10-CM | POA: Insufficient documentation

## 2018-03-22 DIAGNOSIS — I10 Essential (primary) hypertension: Secondary | ICD-10-CM | POA: Diagnosis not present

## 2018-03-22 DIAGNOSIS — I208 Other forms of angina pectoris: Secondary | ICD-10-CM | POA: Insufficient documentation

## 2018-03-22 DIAGNOSIS — R0789 Other chest pain: Secondary | ICD-10-CM | POA: Diagnosis present

## 2018-03-22 DIAGNOSIS — E785 Hyperlipidemia, unspecified: Secondary | ICD-10-CM | POA: Diagnosis not present

## 2018-03-22 DIAGNOSIS — R072 Precordial pain: Secondary | ICD-10-CM | POA: Diagnosis not present

## 2018-03-22 DIAGNOSIS — Z8249 Family history of ischemic heart disease and other diseases of the circulatory system: Secondary | ICD-10-CM | POA: Diagnosis not present

## 2018-03-22 MED ORDER — NITROGLYCERIN 0.4 MG SL SUBL
SUBLINGUAL_TABLET | SUBLINGUAL | Status: AC
  Filled 2018-03-22: qty 2

## 2018-03-22 MED ORDER — IOPAMIDOL (ISOVUE-370) INJECTION 76%
100.0000 mL | Freq: Once | INTRAVENOUS | Status: AC | PRN
  Administered 2018-03-22: 80 mL via INTRAVENOUS

## 2018-03-22 MED ORDER — METOPROLOL TARTRATE 5 MG/5ML IV SOLN
INTRAVENOUS | Status: AC
  Filled 2018-03-22: qty 5

## 2018-03-22 MED ORDER — METOPROLOL TARTRATE 5 MG/5ML IV SOLN
5.0000 mg | INTRAVENOUS | Status: DC | PRN
  Administered 2018-03-22: 5 mg via INTRAVENOUS
  Filled 2018-03-22: qty 5

## 2018-03-22 MED ORDER — NITROGLYCERIN 0.4 MG SL SUBL
0.8000 mg | SUBLINGUAL_TABLET | Freq: Once | SUBLINGUAL | Status: AC
  Administered 2018-03-22: 0.8 mg via SUBLINGUAL
  Filled 2018-03-22: qty 25

## 2018-03-22 NOTE — Progress Notes (Signed)
CT complete. Patient denies any complaints. Patient having snack and coffee.

## 2018-03-26 DIAGNOSIS — S82131A Displaced fracture of medial condyle of right tibia, initial encounter for closed fracture: Secondary | ICD-10-CM | POA: Diagnosis not present

## 2018-03-28 DIAGNOSIS — S82131A Displaced fracture of medial condyle of right tibia, initial encounter for closed fracture: Secondary | ICD-10-CM | POA: Diagnosis not present

## 2018-04-02 DIAGNOSIS — S82131A Displaced fracture of medial condyle of right tibia, initial encounter for closed fracture: Secondary | ICD-10-CM | POA: Diagnosis not present

## 2018-04-04 DIAGNOSIS — S82131A Displaced fracture of medial condyle of right tibia, initial encounter for closed fracture: Secondary | ICD-10-CM | POA: Diagnosis not present

## 2018-04-08 DIAGNOSIS — S82141A Displaced bicondylar fracture of right tibia, initial encounter for closed fracture: Secondary | ICD-10-CM | POA: Diagnosis not present

## 2018-04-08 DIAGNOSIS — S83412D Sprain of medial collateral ligament of left knee, subsequent encounter: Secondary | ICD-10-CM | POA: Diagnosis not present

## 2018-04-08 DIAGNOSIS — M25362 Other instability, left knee: Secondary | ICD-10-CM | POA: Diagnosis not present

## 2018-04-08 DIAGNOSIS — M25562 Pain in left knee: Secondary | ICD-10-CM | POA: Diagnosis not present

## 2018-04-08 DIAGNOSIS — Z96652 Presence of left artificial knee joint: Secondary | ICD-10-CM | POA: Diagnosis not present

## 2018-04-09 DIAGNOSIS — S82131A Displaced fracture of medial condyle of right tibia, initial encounter for closed fracture: Secondary | ICD-10-CM | POA: Diagnosis not present

## 2018-04-11 DIAGNOSIS — H2513 Age-related nuclear cataract, bilateral: Secondary | ICD-10-CM | POA: Diagnosis not present

## 2018-04-16 DIAGNOSIS — S82131A Displaced fracture of medial condyle of right tibia, initial encounter for closed fracture: Secondary | ICD-10-CM | POA: Diagnosis not present

## 2018-04-19 DIAGNOSIS — S82131A Displaced fracture of medial condyle of right tibia, initial encounter for closed fracture: Secondary | ICD-10-CM | POA: Diagnosis not present

## 2018-04-23 DIAGNOSIS — S82131A Displaced fracture of medial condyle of right tibia, initial encounter for closed fracture: Secondary | ICD-10-CM | POA: Diagnosis not present

## 2018-04-29 DIAGNOSIS — S82131A Displaced fracture of medial condyle of right tibia, initial encounter for closed fracture: Secondary | ICD-10-CM | POA: Diagnosis not present

## 2018-05-07 DIAGNOSIS — S82131A Displaced fracture of medial condyle of right tibia, initial encounter for closed fracture: Secondary | ICD-10-CM | POA: Diagnosis not present

## 2018-05-15 DIAGNOSIS — S82131A Displaced fracture of medial condyle of right tibia, initial encounter for closed fracture: Secondary | ICD-10-CM | POA: Diagnosis not present

## 2018-05-20 DIAGNOSIS — I10 Essential (primary) hypertension: Secondary | ICD-10-CM | POA: Diagnosis not present

## 2018-05-20 DIAGNOSIS — E039 Hypothyroidism, unspecified: Secondary | ICD-10-CM | POA: Diagnosis not present

## 2018-05-23 DIAGNOSIS — S82131A Displaced fracture of medial condyle of right tibia, initial encounter for closed fracture: Secondary | ICD-10-CM | POA: Diagnosis not present

## 2018-05-27 DIAGNOSIS — I10 Essential (primary) hypertension: Secondary | ICD-10-CM | POA: Diagnosis not present

## 2018-05-27 DIAGNOSIS — F329 Major depressive disorder, single episode, unspecified: Secondary | ICD-10-CM | POA: Diagnosis not present

## 2018-05-27 DIAGNOSIS — M25562 Pain in left knee: Secondary | ICD-10-CM | POA: Diagnosis not present

## 2018-05-27 DIAGNOSIS — E039 Hypothyroidism, unspecified: Secondary | ICD-10-CM | POA: Diagnosis not present

## 2018-05-27 DIAGNOSIS — Z0000 Encounter for general adult medical examination without abnormal findings: Secondary | ICD-10-CM | POA: Diagnosis not present

## 2018-05-27 DIAGNOSIS — E041 Nontoxic single thyroid nodule: Secondary | ICD-10-CM | POA: Diagnosis not present

## 2018-05-27 DIAGNOSIS — Z23 Encounter for immunization: Secondary | ICD-10-CM | POA: Diagnosis not present

## 2018-05-27 DIAGNOSIS — M25561 Pain in right knee: Secondary | ICD-10-CM | POA: Diagnosis not present

## 2018-05-27 DIAGNOSIS — N951 Menopausal and female climacteric states: Secondary | ICD-10-CM | POA: Diagnosis not present

## 2018-05-28 DIAGNOSIS — S82131A Displaced fracture of medial condyle of right tibia, initial encounter for closed fracture: Secondary | ICD-10-CM | POA: Diagnosis not present

## 2018-05-31 DIAGNOSIS — S82141D Displaced bicondylar fracture of right tibia, subsequent encounter for closed fracture with routine healing: Secondary | ICD-10-CM | POA: Diagnosis not present

## 2018-05-31 DIAGNOSIS — M25361 Other instability, right knee: Secondary | ICD-10-CM | POA: Diagnosis not present

## 2018-05-31 DIAGNOSIS — M1711 Unilateral primary osteoarthritis, right knee: Secondary | ICD-10-CM | POA: Diagnosis not present

## 2018-05-31 DIAGNOSIS — W19XXXD Unspecified fall, subsequent encounter: Secondary | ICD-10-CM | POA: Diagnosis not present

## 2018-06-04 ENCOUNTER — Other Ambulatory Visit: Payer: Self-pay | Admitting: Family Medicine

## 2018-06-04 DIAGNOSIS — S82131A Displaced fracture of medial condyle of right tibia, initial encounter for closed fracture: Secondary | ICD-10-CM | POA: Diagnosis not present

## 2018-06-04 DIAGNOSIS — Z1231 Encounter for screening mammogram for malignant neoplasm of breast: Secondary | ICD-10-CM

## 2018-06-12 ENCOUNTER — Other Ambulatory Visit: Payer: Self-pay | Admitting: Family Medicine

## 2018-06-12 DIAGNOSIS — E041 Nontoxic single thyroid nodule: Secondary | ICD-10-CM

## 2018-06-12 DIAGNOSIS — S82131A Displaced fracture of medial condyle of right tibia, initial encounter for closed fracture: Secondary | ICD-10-CM | POA: Diagnosis not present

## 2018-06-12 DIAGNOSIS — Z0000 Encounter for general adult medical examination without abnormal findings: Secondary | ICD-10-CM

## 2018-06-26 ENCOUNTER — Ambulatory Visit
Admission: RE | Admit: 2018-06-26 | Discharge: 2018-06-26 | Disposition: A | Discharge status: Home / Self Care | Payer: Medicare Other | Source: Ambulatory Visit | Attending: Family Medicine | Admitting: Family Medicine

## 2018-06-26 DIAGNOSIS — Z1231 Encounter for screening mammogram for malignant neoplasm of breast: Secondary | ICD-10-CM | POA: Diagnosis not present

## 2018-06-27 DIAGNOSIS — S82131A Displaced fracture of medial condyle of right tibia, initial encounter for closed fracture: Secondary | ICD-10-CM | POA: Diagnosis not present

## 2018-08-19 ENCOUNTER — Ambulatory Visit: Payer: Medicare Other

## 2018-08-19 ENCOUNTER — Ambulatory Visit
Admission: RE | Admit: 2018-08-19 | Discharge: 2018-08-19 | Disposition: A | Discharge status: Home / Self Care | Payer: Medicare Other | Source: Ambulatory Visit | Attending: Family Medicine | Admitting: Family Medicine

## 2018-08-19 DIAGNOSIS — Z0000 Encounter for general adult medical examination without abnormal findings: Secondary | ICD-10-CM | POA: Diagnosis present

## 2018-08-19 DIAGNOSIS — E041 Nontoxic single thyroid nodule: Secondary | ICD-10-CM

## 2018-12-02 IMAGING — MG MM DIGITAL SCREENING BILAT W/ CAD
4 series · 4 of 4 positions shown · non-contrast
Comparison: Previous exam(s).

CLINICAL DATA: Screening.

EXAM:
DIGITAL SCREENING BILATERAL MAMMOGRAM WITH CAD

[R CC]
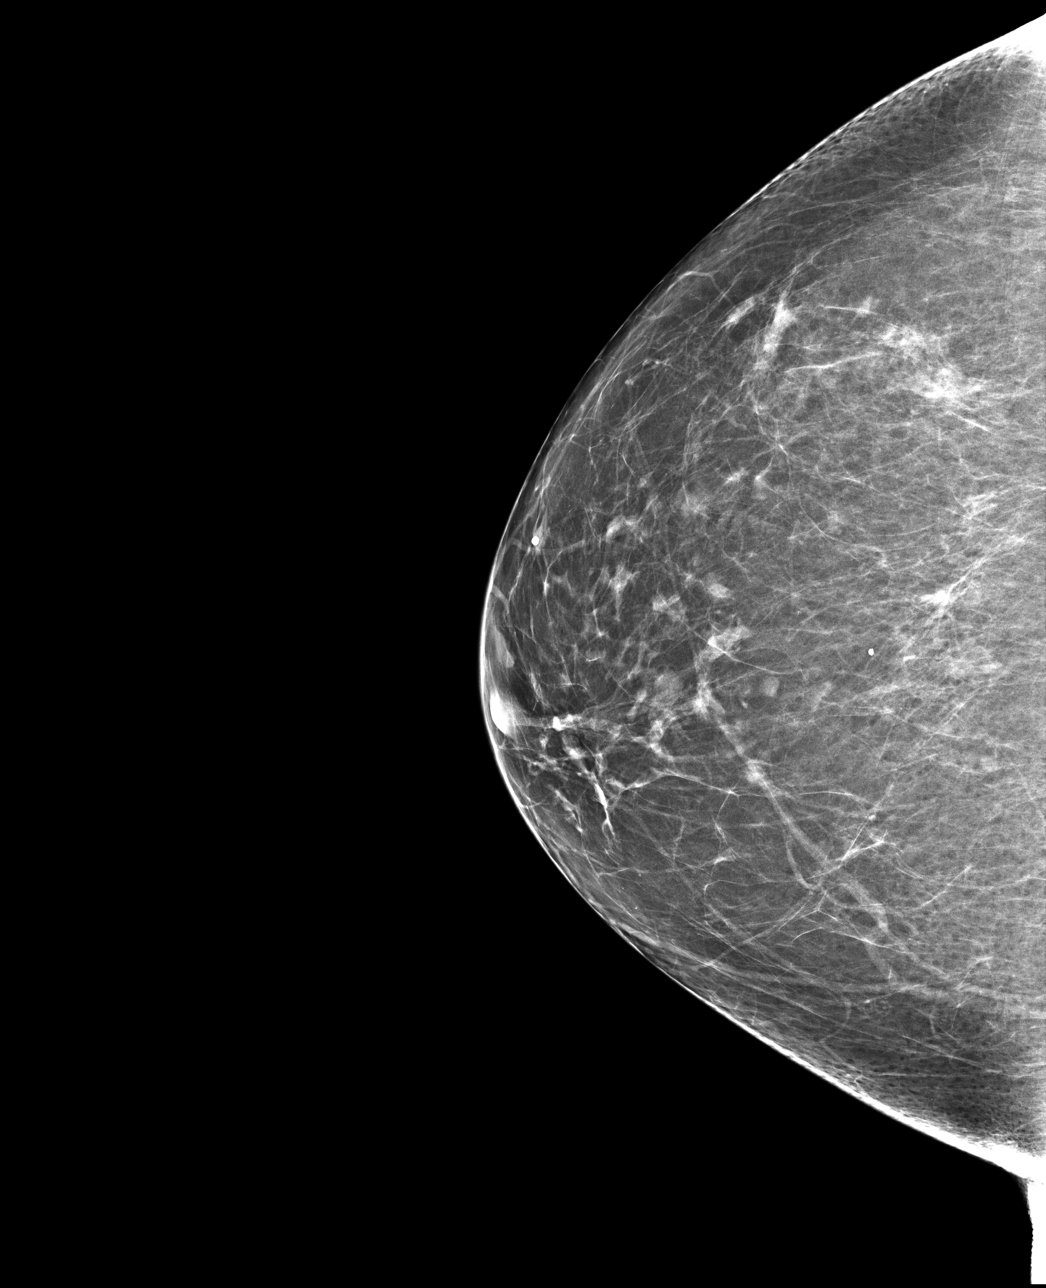

[R MLO]
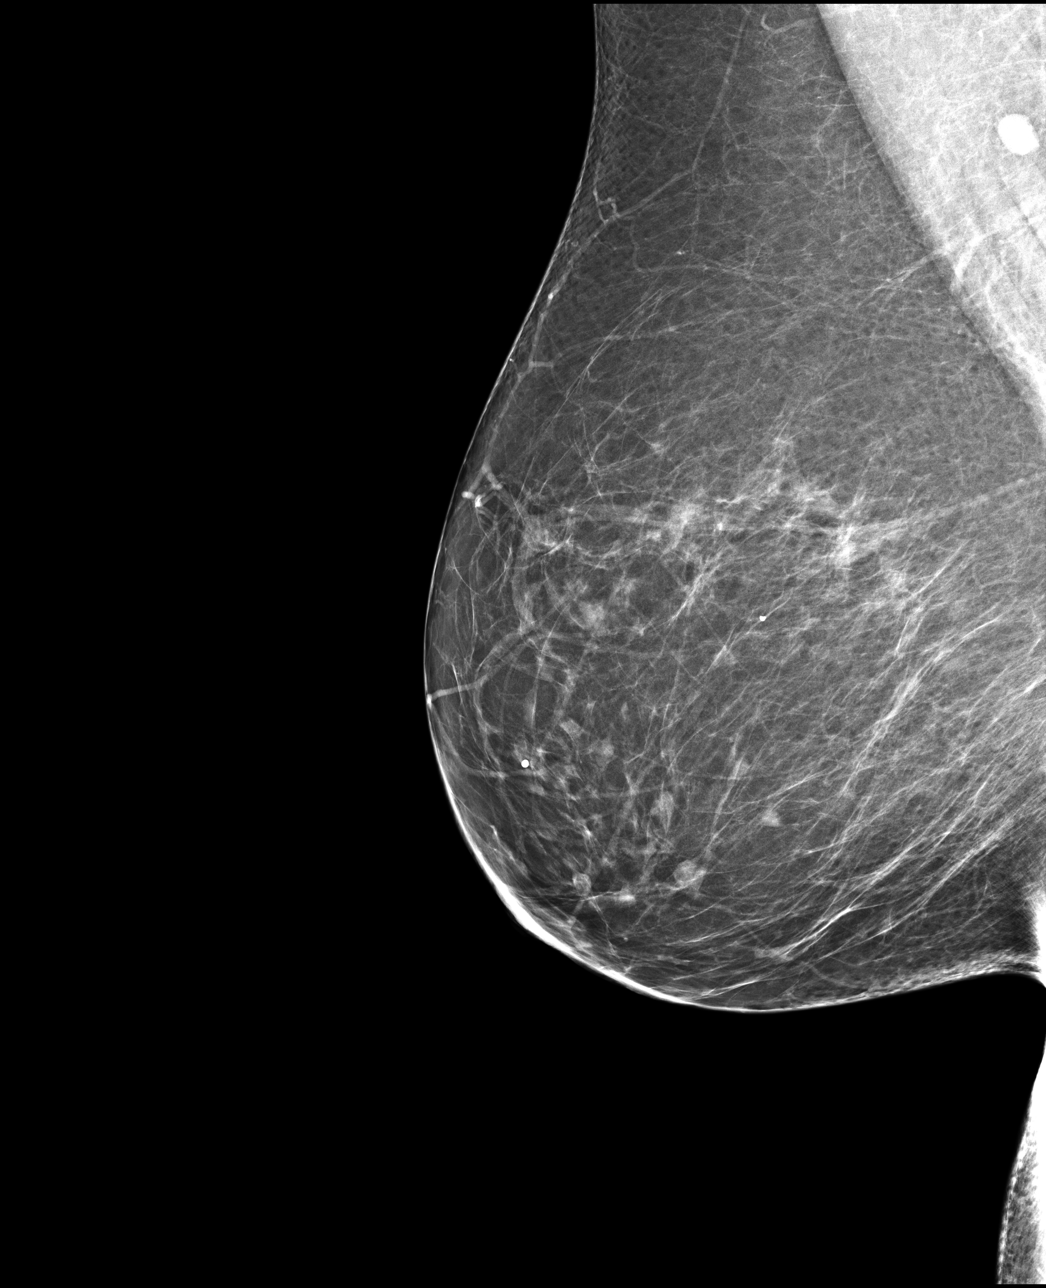

[L MLO]
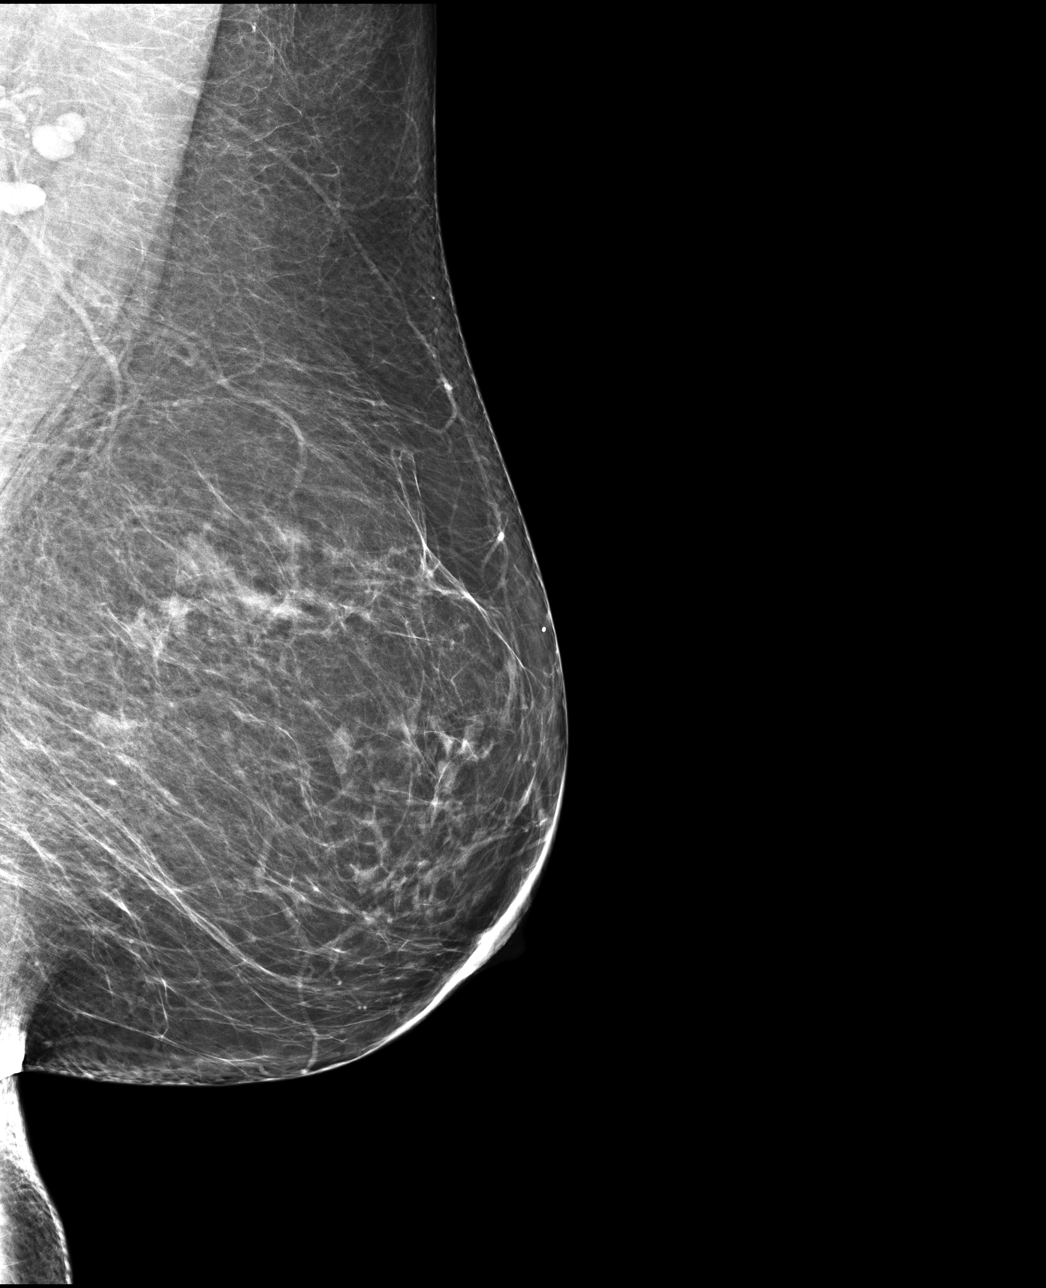

[L CC]
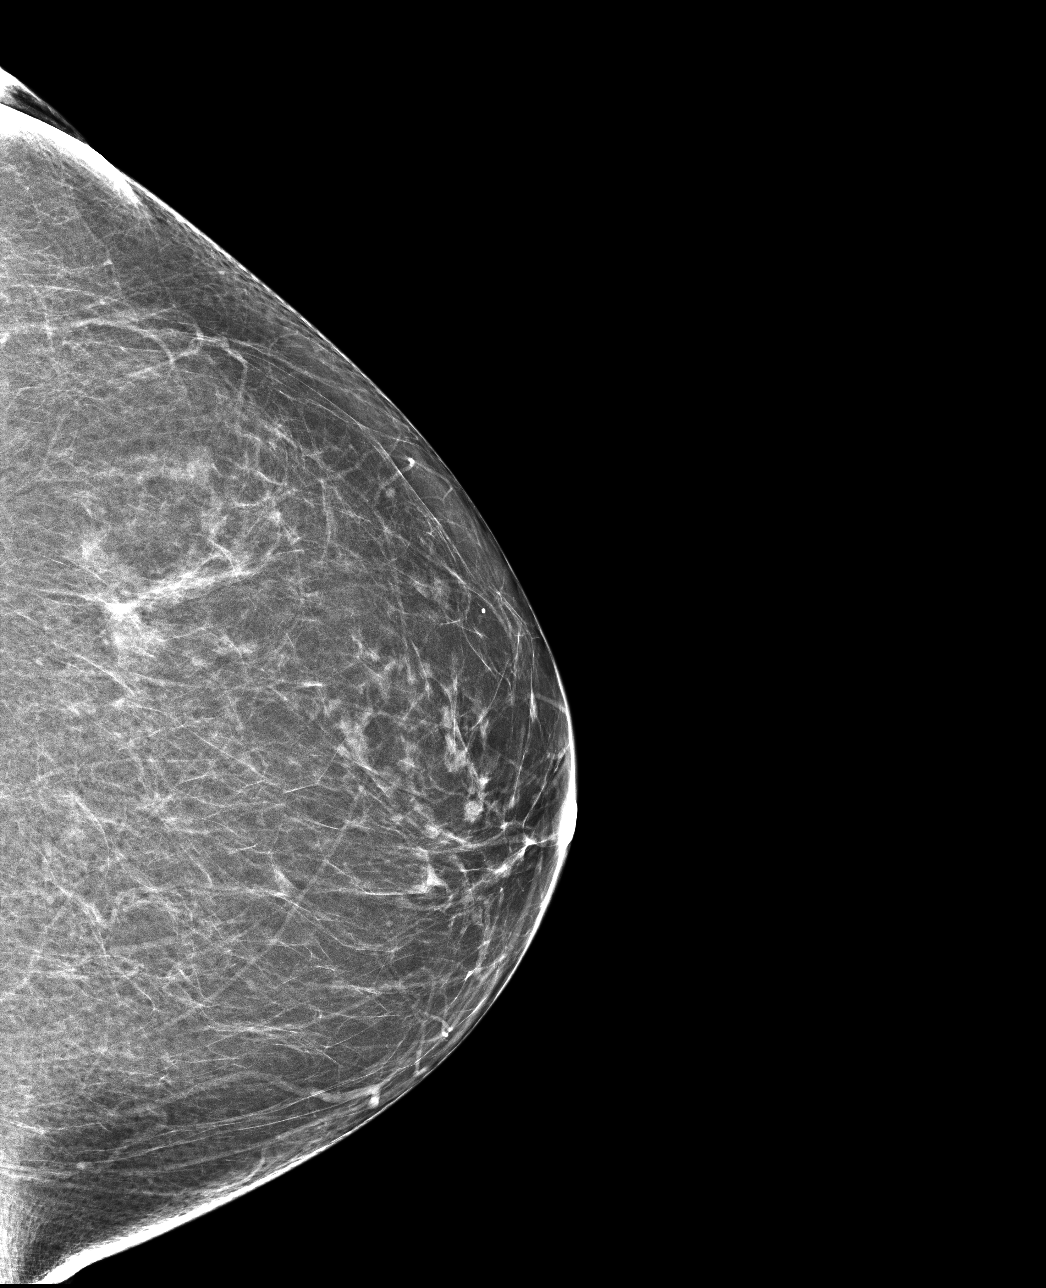

[4 of 4 positions shown; findings below may reference images not displayed]

ACR Breast Density Category b: There are scattered areas of
fibroglandular density.
FINDINGS: There are no findings suspicious for malignancy. Images were
processed with CAD.
IMPRESSION: No mammographic evidence of malignancy. A result letter of this
screening mammogram will be mailed directly to the patient.

RECOMMENDATION:
Screening mammogram in one year. (Code:AS-G-LCT)

BI-RADS CATEGORY  1: Negative.

## 2018-12-09 DIAGNOSIS — G8929 Other chronic pain: Secondary | ICD-10-CM | POA: Insufficient documentation

## 2019-03-24 DIAGNOSIS — S83412D Sprain of medial collateral ligament of left knee, subsequent encounter: Secondary | ICD-10-CM | POA: Insufficient documentation

## 2019-05-21 ENCOUNTER — Other Ambulatory Visit: Payer: Self-pay | Admitting: Family Medicine

## 2019-05-21 DIAGNOSIS — E041 Nontoxic single thyroid nodule: Secondary | ICD-10-CM

## 2019-05-21 DIAGNOSIS — Z0000 Encounter for general adult medical examination without abnormal findings: Secondary | ICD-10-CM

## 2019-05-22 ENCOUNTER — Other Ambulatory Visit: Payer: Self-pay | Admitting: Family Medicine

## 2019-05-22 DIAGNOSIS — Z1231 Encounter for screening mammogram for malignant neoplasm of breast: Secondary | ICD-10-CM

## 2019-05-28 ENCOUNTER — Other Ambulatory Visit: Payer: Self-pay | Admitting: Unknown Physician Specialty

## 2019-05-28 DIAGNOSIS — R1310 Dysphagia, unspecified: Secondary | ICD-10-CM

## 2019-06-06 ENCOUNTER — Other Ambulatory Visit: Payer: Self-pay

## 2019-06-06 ENCOUNTER — Ambulatory Visit
Admission: RE | Admit: 2019-06-06 | Discharge: 2019-06-06 | Disposition: A | Discharge status: Home / Self Care | Payer: Medicare Other | Source: Ambulatory Visit | Attending: Unknown Physician Specialty | Admitting: Unknown Physician Specialty

## 2019-06-06 DIAGNOSIS — R1310 Dysphagia, unspecified: Secondary | ICD-10-CM | POA: Diagnosis present

## 2019-08-06 ENCOUNTER — Ambulatory Visit
Admission: RE | Admit: 2019-08-06 | Discharge: 2019-08-06 | Disposition: A | Discharge status: Home / Self Care | Payer: Medicare Other | Source: Ambulatory Visit | Attending: Family Medicine | Admitting: Family Medicine

## 2019-08-06 DIAGNOSIS — Z1231 Encounter for screening mammogram for malignant neoplasm of breast: Secondary | ICD-10-CM | POA: Diagnosis present

## 2019-08-14 ENCOUNTER — Other Ambulatory Visit: Payer: Self-pay

## 2019-08-14 ENCOUNTER — Encounter: Payer: Self-pay | Admitting: Gastroenterology

## 2019-08-14 ENCOUNTER — Ambulatory Visit (INDEPENDENT_AMBULATORY_CARE_PROVIDER_SITE_OTHER): Payer: Medicare Other | Admitting: Gastroenterology

## 2019-08-14 VITALS — BP 117/77 | HR 80 | Temp 98.2°F | Ht 65.0 in | Wt 237.0 lb

## 2019-08-14 DIAGNOSIS — K219 Gastro-esophageal reflux disease without esophagitis: Secondary | ICD-10-CM

## 2019-08-14 DIAGNOSIS — R1310 Dysphagia, unspecified: Secondary | ICD-10-CM | POA: Diagnosis not present

## 2019-08-14 DIAGNOSIS — R1319 Other dysphagia: Secondary | ICD-10-CM

## 2019-08-14 DIAGNOSIS — F32A Depression, unspecified: Secondary | ICD-10-CM | POA: Insufficient documentation

## 2019-08-14 DIAGNOSIS — M1990 Unspecified osteoarthritis, unspecified site: Secondary | ICD-10-CM | POA: Insufficient documentation

## 2019-08-14 DIAGNOSIS — F329 Major depressive disorder, single episode, unspecified: Secondary | ICD-10-CM | POA: Insufficient documentation

## 2019-08-14 MED ORDER — PANTOPRAZOLE SODIUM 40 MG PO TBEC: 40.0000 mg | DELAYED_RELEASE_TABLET | Freq: Every day | ORAL | 1 refills | Status: DC

## 2019-08-14 NOTE — H&P (View-Only) (Signed)
Gastroenterology Consultation  Referring Provider:     Linus Salmons, MD Primary Care Physician:  Jerl Mina, MD Primary Gastroenterologist:  Dr. Servando Snare     Reason for Consultation:     Dysphagia        HPI:   Holly Lam is a 69 y.o. y/o female referred for consultation & management of dysphagia by Dr. Jerl Mina, MD.  This patient was sent to me by Dr. Jenne Campus from ENT for dysphagia.  The patient had reported dysphagia that has been present for over a year.  She had stated that it was worse with solids.  The patient had a esophageal imaging that showed:  IMPRESSION: 1. No evidence for penetration or aspiration. 2. Mild spontaneous gastroesophageal reflux. 3. Mild esophageal dysmotility. 4. No oropharyngeal diverticulum.  There is also report that the symptoms were intermittent. A week ago had abdominal pain. Went on a bland diet and getting better. The patient reports that she had been on a PPI in the past but stopped it a year ago and reports that she has been having some intermittent heartburn.  The patient reports that she has not had any change in bowel habits such as constipation or diarrhea.  She also denies any black stools or bloody stools.  She states that she has not had to vomit up any food because it will not go down.  Past Medical History:  Diagnosis Date  . Arthritis   . Heartburn   . Hyperlipidemia   . Hypertension   . Hypothyroidism   . Left knee pain     Past Surgical History:  Procedure Laterality Date  . ABDOMINAL HYSTERECTOMY    . ABDOMINAL HYSTERECTOMY     total- painful periods  . BREAST BIOPSY Left 1973   clogged milk duct  . CHOLECYSTECTOMY  1980  . INCONTINENCE SURGERY  2012   also cystocele and rectocele  . leg vein surgery Right 06/2016  . legsurgery    . NASAL SINUS SURGERY  2002  . REPLACEMENT TOTAL KNEE Left 2012  . THYROID SURGERY  2002  . ULNAR NERVE TRANSPOSITION Left 2007    Prior to Admission medications   Medication  Sig Start Date End Date Taking? Authorizing Provider  acetaminophen (TYLENOL) 500 MG tablet Take 500 mg by mouth every 6 (six) hours as needed.    [provider]  aspirin 325 MG tablet Take 325 mg by mouth daily.    [provider]  cetirizine (ZYRTEC) 10 MG chewable tablet Chew 10 mg by mouth daily.    [provider]  estradiol (ESTRACE) 1 MG tablet Take 1 tablet (1 mg total) by mouth daily. 03/22/17   Defrancesco, Prentice Docker, MD  Estradiol 10 MCG TABS vaginal tablet Place 1 tablet (10 mcg total) vaginally at bedtime. 03/08/17   Defrancesco, Prentice Docker, MD  fexofenadine (ALLEGRA) 180 MG tablet Take 180 mg by mouth daily.    [provider]  Light Mineral Oil-Mineral Oil (RETAINE MGD) 0.5-0.5 % EMUL Apply to eye.    [provider]  MEGARED OMEGA-3 KRILL OIL PO  03/10/16   [provider]  metoprolol tartrate (LOPRESSOR) 25 MG tablet Take 0.5 tablets (12.5 mg total) by mouth 2 (two) times daily. 01/23/18 04/23/18  End, Cristal Deer, MD  olmesartan (BENICAR) 40 MG tablet  02/01/17   [provider]  Oxycodone HCl 10 MG TABS Take 10 mg by mouth every 6 (six) hours as needed.    [provider]  SYNTHROID 50 MCG tablet  02/24/17   [provider]  venlafaxine XR (EFFEXOR-XR) 150 MG 24 hr capsule  02/24/17   [provider]  VICTOZA 18 MG/3ML SOPN  02/12/17   [provider]    Family History  Problem Relation Age of Onset  . Prostate cancer Brother   . Heart attack Brother   . Diabetes Father   . Heart attack Father   . Colon cancer Paternal Aunt   . Heart attack Son   . COPD Son   . Obesity Son   . Stroke Mother   . Diabetes Mellitus II Mother   . Heart disease Mother   . Kidney failure Mother   . Breast cancer Neg Hx   . Kidney cancer Neg Hx   . Bladder Cancer Neg Hx   . Ovarian cancer Neg Hx      Social History   Tobacco Use  . Smoking status: Never Smoker  . Smokeless tobacco: Never Used    Substance Use Topics  . Alcohol use: Yes    Comment: 2 drinks every 6 months  . Drug use: No    Allergies as of 08/14/2019 - Review Complete 01/23/2018  Allergen Reaction Noted  . Tape Rash   . Levofloxacin Nausea Only 08/03/2015  . Moxifloxacin Nausea Only 08/03/2015  . Prednisone  08/03/2015    Review of Systems:    All systems reviewed and negative except where noted in HPI.   Physical Exam:  There were no vitals taken for this visit. No LMP recorded. Patient has had a hysterectomy. General:   Alert,  Well-developed, well-nourished, pleasant and cooperative in NAD Head:  Normocephalic and atraumatic. Eyes:  Sclera clear, no icterus.   Conjunctiva pink. Ears:  Normal auditory acuity. Neck:  Supple; no masses or thyromegaly. Lungs:  Respirations even and unlabored.  Clear throughout to auscultation.   No wheezes, crackles, or rhonchi. No acute distress. Heart:  Regular rate and rhythm; no murmurs, clicks, rubs, or gallops. Abdomen:  Normal bowel sounds.  No bruits.  Soft, non-tender and non-distended without masses, hepatosplenomegaly or hernias noted.  No guarding or rebound tenderness.  Negative Carnett sign.   Rectal:  Deferred.  Pulses:  Normal pulses noted. Extremities:  No clubbing or edema.  No cyanosis. Neurologic:  Alert and oriented x3;  grossly normal neurologically. Skin:  Intact without significant lesions or rashes.  No jaundice. Lymph Nodes:  No significant cervical adenopathy. Psych:  Alert and cooperative. Normal mood and affect.  Imaging Studies: MM 3D SCREEN BREAST BILATERAL  Result Date: 08/07/2019 CLINICAL DATA:  Screening. EXAM: DIGITAL SCREENING BILATERAL MAMMOGRAM WITH TOMO AND CAD COMPARISON:  Previous exam(s). ACR Breast Density Category b: There are scattered areas of fibroglandular density. FINDINGS: There are no findings suspicious for malignancy. Images were processed with CAD. IMPRESSION: No mammographic evidence of malignancy. A result letter  of this screening mammogram will be mailed directly to the patient. RECOMMENDATION: Screening mammogram in one year. (Code:SM-B-01Y) BI-RADS CATEGORY  1: Negative. Electronically Signed   By: Drew  Davis M.D.   On: 08/07/2019 10:57    Assessment and Plan:   Holly Lam is a 68 y.o. y/o female who comes in today with some dysphagia and a history of heartburn that has been worse recently.  The patient will be started on pantoprazole for her heartburn and she will also be set up for an EGD to rule out any strictures or narrowings as the cause of her dysphagia.  The   patient has been explained the plan and agrees with it.    Lucilla Lame, MD. Marval Regal    Note: This dictation was prepared with Dragon dictation along with smaller phrase technology. Any transcriptional errors that result from this process are unintentional.

## 2019-08-14 NOTE — Progress Notes (Signed)
Gastroenterology Consultation  Referring Provider:     Linus Salmons, MD Primary Care Physician:  Jerl Mina, MD Primary Gastroenterologist:  Dr. Servando Snare     Reason for Consultation:     Dysphagia        HPI:   Holly Lam is a 69 y.o. y/o female referred for consultation & management of dysphagia by Dr. Jerl Mina, MD.  This patient was sent to me by Dr. Jenne Campus from ENT for dysphagia.  The patient had reported dysphagia that has been present for over a year.  She had stated that it was worse with solids.  The patient had a esophageal imaging that showed:  IMPRESSION: 1. No evidence for penetration or aspiration. 2. Mild spontaneous gastroesophageal reflux. 3. Mild esophageal dysmotility. 4. No oropharyngeal diverticulum.  There is also report that the symptoms were intermittent. A week ago had abdominal pain. Went on a bland diet and getting better. The patient reports that she had been on a PPI in the past but stopped it a year ago and reports that she has been having some intermittent heartburn.  The patient reports that she has not had any change in bowel habits such as constipation or diarrhea.  She also denies any black stools or bloody stools.  She states that she has not had to vomit up any food because it will not go down.  Past Medical History:  Diagnosis Date  . Arthritis   . Heartburn   . Hyperlipidemia   . Hypertension   . Hypothyroidism   . Left knee pain     Past Surgical History:  Procedure Laterality Date  . ABDOMINAL HYSTERECTOMY    . ABDOMINAL HYSTERECTOMY     total- painful periods  . BREAST BIOPSY Left 1973   clogged milk duct  . CHOLECYSTECTOMY  1980  . INCONTINENCE SURGERY  2012   also cystocele and rectocele  . leg vein surgery Right 06/2016  . legsurgery    . NASAL SINUS SURGERY  2002  . REPLACEMENT TOTAL KNEE Left 2012  . THYROID SURGERY  2002  . ULNAR NERVE TRANSPOSITION Left 2007    Prior to Admission medications   Medication  Sig Start Date End Date Taking? Authorizing Provider  acetaminophen (TYLENOL) 500 MG tablet Take 500 mg by mouth every 6 (six) hours as needed.    [provider]  aspirin 325 MG tablet Take 325 mg by mouth daily.    [provider]  cetirizine (ZYRTEC) 10 MG chewable tablet Chew 10 mg by mouth daily.    [provider]  estradiol (ESTRACE) 1 MG tablet Take 1 tablet (1 mg total) by mouth daily. 03/22/17   Defrancesco, Prentice Docker, MD  Estradiol 10 MCG TABS vaginal tablet Place 1 tablet (10 mcg total) vaginally at bedtime. 03/08/17   Defrancesco, Prentice Docker, MD  fexofenadine (ALLEGRA) 180 MG tablet Take 180 mg by mouth daily.    [provider]  Light Mineral Oil-Mineral Oil (RETAINE MGD) 0.5-0.5 % EMUL Apply to eye.    [provider]  MEGARED OMEGA-3 KRILL OIL PO  03/10/16   [provider]  metoprolol tartrate (LOPRESSOR) 25 MG tablet Take 0.5 tablets (12.5 mg total) by mouth 2 (two) times daily. 01/23/18 04/23/18  End, Cristal Deer, MD  olmesartan (BENICAR) 40 MG tablet  02/01/17   [provider]  Oxycodone HCl 10 MG TABS Take 10 mg by mouth every 6 (six) hours as needed.    [provider]  SYNTHROID 50 MCG tablet  02/24/17   [provider]  venlafaxine XR (EFFEXOR-XR) 150 MG 24 hr capsule  02/24/17   [provider]  VICTOZA 18 MG/3ML SOPN  02/12/17   [provider]    Family History  Problem Relation Age of Onset  . Prostate cancer Brother   . Heart attack Brother   . Diabetes Father   . Heart attack Father   . Colon cancer Paternal Aunt   . Heart attack Son   . COPD Son   . Obesity Son   . Stroke Mother   . Diabetes Mellitus II Mother   . Heart disease Mother   . Kidney failure Mother   . Breast cancer Neg Hx   . Kidney cancer Neg Hx   . Bladder Cancer Neg Hx   . Ovarian cancer Neg Hx      Social History   Tobacco Use  . Smoking status: Never Smoker  . Smokeless tobacco: Never Used    Substance Use Topics  . Alcohol use: Yes    Comment: 2 drinks every 6 months  . Drug use: No    Allergies as of 08/14/2019 - Review Complete 01/23/2018  Allergen Reaction Noted  . Tape Rash   . Levofloxacin Nausea Only 08/03/2015  . Moxifloxacin Nausea Only 08/03/2015  . Prednisone  08/03/2015    Review of Systems:    All systems reviewed and negative except where noted in HPI.   Physical Exam:  There were no vitals taken for this visit. No LMP recorded. Patient has had a hysterectomy. General:   Alert,  Well-developed, well-nourished, pleasant and cooperative in NAD Head:  Normocephalic and atraumatic. Eyes:  Sclera clear, no icterus.   Conjunctiva pink. Ears:  Normal auditory acuity. Neck:  Supple; no masses or thyromegaly. Lungs:  Respirations even and unlabored.  Clear throughout to auscultation.   No wheezes, crackles, or rhonchi. No acute distress. Heart:  Regular rate and rhythm; no murmurs, clicks, rubs, or gallops. Abdomen:  Normal bowel sounds.  No bruits.  Soft, non-tender and non-distended without masses, hepatosplenomegaly or hernias noted.  No guarding or rebound tenderness.  Negative Carnett sign.   Rectal:  Deferred.  Pulses:  Normal pulses noted. Extremities:  No clubbing or edema.  No cyanosis. Neurologic:  Alert and oriented x3;  grossly normal neurologically. Skin:  Intact without significant lesions or rashes.  No jaundice. Lymph Nodes:  No significant cervical adenopathy. Psych:  Alert and cooperative. Normal mood and affect.  Imaging Studies: MM 3D SCREEN BREAST BILATERAL  Result Date: 08/07/2019 CLINICAL DATA:  Screening. EXAM: DIGITAL SCREENING BILATERAL MAMMOGRAM WITH TOMO AND CAD COMPARISON:  Previous exam(s). ACR Breast Density Category b: There are scattered areas of fibroglandular density. FINDINGS: There are no findings suspicious for malignancy. Images were processed with CAD. IMPRESSION: No mammographic evidence of malignancy. A result letter  of this screening mammogram will be mailed directly to the patient. RECOMMENDATION: Screening mammogram in one year. (Code:SM-B-01Y) BI-RADS CATEGORY  1: Negative. Electronically Signed   By: Annia Belt M.D.   On: 08/07/2019 10:57    Assessment and Plan:   Holly Lam is a 69 y.o. y/o female who comes in today with some dysphagia and a history of heartburn that has been worse recently.  The patient will be started on pantoprazole for her heartburn and she will also be set up for an EGD to rule out any strictures or narrowings as the cause of her dysphagia.  The  patient has been explained the plan and agrees with it.    Lucilla Lame, MD. Marval Regal    Note: This dictation was prepared with Dragon dictation along with smaller phrase technology. Any transcriptional errors that result from this process are unintentional.

## 2019-08-20 ENCOUNTER — Other Ambulatory Visit
Admission: RE | Admit: 2019-08-20 | Discharge: 2019-08-20 | Disposition: A | Discharge status: Home / Self Care | Payer: Medicare Other | Source: Ambulatory Visit | Attending: Gastroenterology | Admitting: Gastroenterology

## 2019-08-20 DIAGNOSIS — Z20822 Contact with and (suspected) exposure to covid-19: Secondary | ICD-10-CM | POA: Diagnosis not present

## 2019-08-20 DIAGNOSIS — Z01812 Encounter for preprocedural laboratory examination: Secondary | ICD-10-CM | POA: Insufficient documentation

## 2019-08-20 LAB — SARS CORONAVIRUS 2 (TAT 6-24 HRS): SARS Coronavirus 2: NEGATIVE

## 2019-08-22 ENCOUNTER — Encounter: Payer: Self-pay | Admitting: Gastroenterology

## 2019-08-22 ENCOUNTER — Encounter
Admission: RE | Disposition: A | Discharge status: Home / Self Care | Payer: Self-pay | Source: Home / Self Care | Attending: Gastroenterology

## 2019-08-22 ENCOUNTER — Ambulatory Visit
Admission: RE | Admit: 2019-08-22 | Discharge: 2019-08-22 | Disposition: A | Discharge status: Home / Self Care | Payer: Medicare Other | Attending: Gastroenterology | Admitting: Gastroenterology

## 2019-08-22 ENCOUNTER — Ambulatory Visit: Payer: Medicare Other | Admitting: Anesthesiology

## 2019-08-22 DIAGNOSIS — Z79899 Other long term (current) drug therapy: Secondary | ICD-10-CM | POA: Diagnosis not present

## 2019-08-22 DIAGNOSIS — K449 Diaphragmatic hernia without obstruction or gangrene: Secondary | ICD-10-CM | POA: Insufficient documentation

## 2019-08-22 DIAGNOSIS — Z96652 Presence of left artificial knee joint: Secondary | ICD-10-CM | POA: Insufficient documentation

## 2019-08-22 DIAGNOSIS — Z79890 Hormone replacement therapy (postmenopausal): Secondary | ICD-10-CM | POA: Diagnosis not present

## 2019-08-22 DIAGNOSIS — R1319 Other dysphagia: Secondary | ICD-10-CM

## 2019-08-22 DIAGNOSIS — E785 Hyperlipidemia, unspecified: Secondary | ICD-10-CM | POA: Diagnosis not present

## 2019-08-22 DIAGNOSIS — Z7982 Long term (current) use of aspirin: Secondary | ICD-10-CM | POA: Insufficient documentation

## 2019-08-22 DIAGNOSIS — K228 Other specified diseases of esophagus: Secondary | ICD-10-CM | POA: Insufficient documentation

## 2019-08-22 DIAGNOSIS — I10 Essential (primary) hypertension: Secondary | ICD-10-CM | POA: Insufficient documentation

## 2019-08-22 DIAGNOSIS — R1310 Dysphagia, unspecified: Secondary | ICD-10-CM

## 2019-08-22 DIAGNOSIS — E039 Hypothyroidism, unspecified: Secondary | ICD-10-CM | POA: Insufficient documentation

## 2019-08-22 DIAGNOSIS — K219 Gastro-esophageal reflux disease without esophagitis: Secondary | ICD-10-CM

## 2019-08-22 HISTORY — DX: Nausea with vomiting, unspecified: R11.2

## 2019-08-22 HISTORY — PX: ESOPHAGOGASTRODUODENOSCOPY (EGD) WITH PROPOFOL: SHX5813

## 2019-08-22 HISTORY — DX: Other complications of anesthesia, initial encounter: T88.59XA

## 2019-08-22 HISTORY — DX: Other specified postprocedural states: Z98.890

## 2019-08-22 SURGERY — ESOPHAGOGASTRODUODENOSCOPY (EGD) WITH PROPOFOL
Anesthesia: General

## 2019-08-22 MED ORDER — LIDOCAINE HCL (PF) 2 % IJ SOLN
INTRAMUSCULAR | Status: AC
  Filled 2019-08-22: qty 5

## 2019-08-22 MED ORDER — SODIUM CHLORIDE 0.9 % IV SOLN: INTRAVENOUS | Status: DC

## 2019-08-22 MED ORDER — PROPOFOL 500 MG/50ML IV EMUL
INTRAVENOUS | Status: AC
  Filled 2019-08-22: qty 50

## 2019-08-22 MED ORDER — PROPOFOL 10 MG/ML IV BOLUS
INTRAVENOUS | Status: DC | PRN
  Administered 2019-08-22: 50 mg via INTRAVENOUS
  Administered 2019-08-22: 70 mg via INTRAVENOUS
  Administered 2019-08-22: 20 mg via INTRAVENOUS

## 2019-08-22 MED ORDER — ONDANSETRON HCL 4 MG/2ML IJ SOLN
INTRAMUSCULAR | Status: AC
  Filled 2019-08-22: qty 2

## 2019-08-22 MED ORDER — SODIUM CHLORIDE 0.9 % IV SOLN: INTRAVENOUS | Status: DC | PRN

## 2019-08-22 MED ORDER — ONDANSETRON HCL 4 MG/2ML IJ SOLN
INTRAMUSCULAR | Status: DC | PRN
  Administered 2019-08-22: 4 mg via INTRAVENOUS

## 2019-08-22 NOTE — Anesthesia Postprocedure Evaluation (Signed)
Anesthesia Post Note  Patient: Ajiah Mcglinn  Procedure(s) Performed: ESOPHAGOGASTRODUODENOSCOPY (EGD) WITH PROPOFOL (N/A )  Patient location during evaluation: Endoscopy Anesthesia Type: General Level of consciousness: awake and alert Pain management: pain level controlled Vital Signs Assessment: post-procedure vital signs reviewed and stable Respiratory status: spontaneous breathing, nonlabored ventilation, respiratory function stable and patient connected to nasal cannula oxygen Cardiovascular status: blood pressure returned to baseline and stable Postop Assessment: no apparent nausea or vomiting Anesthetic complications: no     Last Vitals:  Vitals:   08/22/19 1150 08/22/19 1159  BP: 110/68 115/60  Pulse: 80 77  Resp: (!) 23 17  Temp:    SpO2: 94% 99%    Last Pain:  Vitals:   08/22/19 1159  TempSrc:   PainSc: 0-No pain                 Corinda Gubler

## 2019-08-22 NOTE — Anesthesia Preprocedure Evaluation (Signed)
Anesthesia Evaluation  Patient identified by MRN, date of birth, ID band Patient awake    Reviewed: Allergy & Precautions, NPO status , Patient's Chart, lab work & pertinent test results  History of Anesthesia Complications (+) PONVNegative for: history of anesthetic complications  Airway Mallampati: II  TM Distance: >3 FB Neck ROM: Full    Dental no notable dental hx. (+) Teeth Intact   Pulmonary neg pulmonary ROS, neg sleep apnea, neg COPD, Patient abstained from smoking.Not current smoker,    Pulmonary exam normal breath sounds clear to auscultation       Cardiovascular Exercise Tolerance: Good METShypertension, (-) CAD and (-) Past MI (-) dysrhythmias  Rhythm:Regular Rate:Normal - Systolic murmurs    Neuro/Psych PSYCHIATRIC DISORDERS Depression negative neurological ROS     GI/Hepatic GERD  ,(+)     (-) substance abuse  ,   Endo/Other  neg diabetesHypothyroidism   Renal/GU negative Renal ROS     Musculoskeletal   Abdominal   Peds  Hematology   Anesthesia Other Findings Past Medical History: No date: Arthritis No date: Complication of anesthesia No date: Heartburn No date: Hyperlipidemia No date: Hypertension No date: Hypothyroidism No date: Left knee pain No date: PONV (postoperative nausea and vomiting)  Reproductive/Obstetrics                             Anesthesia Physical Anesthesia Plan  ASA: II  Anesthesia Plan: General   Post-op Pain Management:    Induction: Intravenous  PONV Risk Score and Plan: 4 or greater and Ondansetron, TIVA and Propofol infusion  Airway Management Planned: Nasal Cannula  Additional Equipment: None  Intra-op Plan:   Post-operative Plan:   Informed Consent: I have reviewed the patients History and Physical, chart, labs and discussed the procedure including the risks, benefits and alternatives for the proposed anesthesia with the  patient or authorized representative who has indicated his/her understanding and acceptance.     Dental advisory given  Plan Discussed with: CRNA and Surgeon  Anesthesia Plan Comments: (Discussed risks of anesthesia with patient, including possibility of difficulty with spontaneous ventilation under anesthesia necessitating airway intervention, PONV, and rare risks such as cardiac or respiratory events. Patient understands.)        Anesthesia Quick Evaluation

## 2019-08-22 NOTE — Op Note (Signed)
[facility] Gastroenterology Patient Name: Holly Lam Procedure Date: 08/22/2019 11:09 AM MRN: [id number] Account #: [id number] Date of Birth: 02/13/1951 Admit Type: Outpatient Age: 69 Room: [facility] ENDO ROOM 4 Gender: Female Note Status: Finalized Procedure:             Upper GI endoscopy Indications:           Dysphagia Providers:             Lucilla Lame MD, MD Referring MD:          Irven Easterly. Kary Kos, MD (Referring MD) Medicines:             Propofol per Anesthesia Complications:         No immediate complications. Procedure:             Pre-Anesthesia Assessment:                        - Prior to the procedure, a History and Physical was                         performed, and patient medications and allergies were                         reviewed. The patient's tolerance of previous                         anesthesia was also reviewed. The risks and benefits                         of the procedure and the sedation options and risks                         were discussed with the patient. All questions were                         answered, and informed consent was obtained. Prior                         Anticoagulants: The patient has taken no previous                         anticoagulant or antiplatelet agents. ASA Grade                         Assessment: II - A patient with mild systemic disease.                         After reviewing the risks and benefits, the patient                         was deemed in satisfactory condition to undergo the                         procedure.                        After obtaining informed consent, the endoscope was  passed under direct vision. Throughout the procedure,                         the patient's blood pressure, pulse, and oxygen                         saturations were monitored continuously. The Endoscope                         was introduced through the mouth, and advanced to the                       second part of duodenum. The upper GI endoscopy was                         accomplished without difficulty. The patient tolerated                         the procedure well. Findings:      The Z-line was irregular and was found at the gastroesophageal junction.      A small hiatal hernia was present.      The stomach was normal.      The examined duodenum was normal.      Biopsies were obtained with cold forceps for histology in the middle       third of the esophagus. Impression:            - Z-line irregular, at the gastroesophageal junction.                        - Small hiatal hernia.                        - Normal stomach.                        - Normal examined duodenum.                        - Biopsy performed in the middle third of the                         esophagus. Recommendation:        - Discharge patient to home.                        - Resume previous diet.                        - Continue present medications.                        - Await pathology results. Procedure Code(s):     --- Professional ---                        [phone], Esophagogastroduodenoscopy, flexible,                         transoral; with biopsy, single or multiple Diagnosis Code(s):     --- Professional ---  R13.10, Dysphagia, unspecified CPT copyright 2019 American Medical Association. All rights reserved. The codes documented in this report are preliminary and upon coder review may  be revised to meet current compliance requirements. Midge Minium MD, MD 08/22/2019 11:33:47 AM This report has been signed electronically. Number of Addenda: 0 Note Initiated On: 08/22/2019 11:09 AM Estimated Blood Loss:  Estimated blood loss: none.      [facility]

## 2019-08-22 NOTE — Transfer of Care (Signed)
Immediate Anesthesia Transfer of Care Note  Patient: Holly Lam  Procedure(s) Performed: ESOPHAGOGASTRODUODENOSCOPY (EGD) WITH PROPOFOL (N/A )  Patient Location: PACU and Endoscopy Unit  Anesthesia Type:General  Level of Consciousness: awake, alert  and oriented  Airway & Oxygen Therapy: Patient Spontanous Breathing  Post-op Assessment: Report given to RN and Post -op Vital signs reviewed and stable  Post vital signs: Reviewed and stable  Last Vitals:  Vitals Value Taken Time  BP    Temp    Pulse 94 08/22/19 1136  Resp 25 08/22/19 1136  SpO2 98 % 08/22/19 1136  Vitals shown include unvalidated device data.  Last Pain:  Vitals:   08/22/19 1050  TempSrc: Oral  PainSc: 0-No pain         Complications: No apparent anesthesia complications

## 2019-08-22 NOTE — Interval H&P Note (Signed)
History and Physical Interval Note:  08/22/2019 11:18 AM  Holly Lam  has presented today for surgery, with the diagnosis of GERD K21.9 Dsyphagia R13.10.  The various methods of treatment have been discussed with the patient and family. After consideration of risks, benefits and other options for treatment, the patient has consented to  Procedure(s): ESOPHAGOGASTRODUODENOSCOPY (EGD) WITH PROPOFOL (N/A) as a surgical intervention.  The patient's history has been reviewed, patient examined, no change in status, stable for surgery.  I have reviewed the patient's chart and labs.  Questions were answered to the patient's satisfaction.     Zaryan Yakubov [employer]

## 2019-08-25 ENCOUNTER — Encounter: Payer: Self-pay | Admitting: *Deleted

## 2019-08-25 LAB — SURGICAL PATHOLOGY

## 2019-08-26 ENCOUNTER — Encounter: Payer: Self-pay | Admitting: Gastroenterology

## 2019-09-03 ENCOUNTER — Other Ambulatory Visit: Payer: Self-pay

## 2019-09-03 ENCOUNTER — Ambulatory Visit
Admission: RE | Admit: 2019-09-03 | Discharge: 2019-09-03 | Disposition: A | Discharge status: Home / Self Care | Payer: Medicare Other | Source: Ambulatory Visit | Attending: Family Medicine | Admitting: Family Medicine

## 2019-09-03 DIAGNOSIS — E041 Nontoxic single thyroid nodule: Secondary | ICD-10-CM | POA: Insufficient documentation

## 2019-09-03 DIAGNOSIS — Z0000 Encounter for general adult medical examination without abnormal findings: Secondary | ICD-10-CM | POA: Insufficient documentation

## 2020-01-17 ENCOUNTER — Other Ambulatory Visit: Payer: Self-pay | Admitting: Gastroenterology

## 2020-05-25 ENCOUNTER — Other Ambulatory Visit: Payer: Self-pay | Admitting: Family Medicine

## 2020-05-25 DIAGNOSIS — Z1231 Encounter for screening mammogram for malignant neoplasm of breast: Secondary | ICD-10-CM

## 2020-08-06 ENCOUNTER — Ambulatory Visit
Admission: RE | Admit: 2020-08-06 | Discharge: 2020-08-06 | Disposition: A | Discharge status: Home / Self Care | Payer: Medicare Other | Source: Ambulatory Visit | Attending: Family Medicine | Admitting: Family Medicine

## 2020-08-06 ENCOUNTER — Other Ambulatory Visit: Payer: Self-pay

## 2020-08-06 DIAGNOSIS — Z1231 Encounter for screening mammogram for malignant neoplasm of breast: Secondary | ICD-10-CM | POA: Insufficient documentation

## 2021-05-24 ENCOUNTER — Other Ambulatory Visit: Payer: Self-pay | Admitting: Family Medicine

## 2021-05-24 DIAGNOSIS — Z1231 Encounter for screening mammogram for malignant neoplasm of breast: Secondary | ICD-10-CM

## 2021-08-10 ENCOUNTER — Other Ambulatory Visit: Payer: Self-pay

## 2021-08-10 ENCOUNTER — Ambulatory Visit
Admission: RE | Admit: 2021-08-10 | Discharge: 2021-08-10 | Disposition: A | Discharge status: Home / Self Care | Payer: Medicare Other | Source: Ambulatory Visit | Attending: Family Medicine | Admitting: Family Medicine

## 2021-08-10 DIAGNOSIS — Z1231 Encounter for screening mammogram for malignant neoplasm of breast: Secondary | ICD-10-CM | POA: Diagnosis present

## 2022-05-30 ENCOUNTER — Encounter: Payer: Self-pay | Admitting: Ophthalmology

## 2022-05-31 NOTE — Discharge Instructions (Signed)

## 2022-06-06 ENCOUNTER — Ambulatory Visit: Payer: Medicare Other | Admitting: General Practice

## 2022-06-06 ENCOUNTER — Encounter: Payer: Self-pay | Admitting: Ophthalmology

## 2022-06-06 ENCOUNTER — Ambulatory Visit
Admission: RE | Admit: 2022-06-06 | Discharge: 2022-06-06 | Disposition: A | Discharge status: Home / Self Care | Payer: Medicare Other | Source: Ambulatory Visit | Attending: Ophthalmology | Admitting: Ophthalmology

## 2022-06-06 ENCOUNTER — Encounter
Admission: RE | Disposition: A | Discharge status: Home / Self Care | Payer: Self-pay | Source: Ambulatory Visit | Attending: Ophthalmology

## 2022-06-06 ENCOUNTER — Other Ambulatory Visit: Payer: Self-pay

## 2022-06-06 DIAGNOSIS — E119 Type 2 diabetes mellitus without complications: Secondary | ICD-10-CM | POA: Diagnosis not present

## 2022-06-06 DIAGNOSIS — F32A Depression, unspecified: Secondary | ICD-10-CM | POA: Diagnosis not present

## 2022-06-06 DIAGNOSIS — I10 Essential (primary) hypertension: Secondary | ICD-10-CM | POA: Diagnosis not present

## 2022-06-06 DIAGNOSIS — K219 Gastro-esophageal reflux disease without esophagitis: Secondary | ICD-10-CM | POA: Diagnosis not present

## 2022-06-06 DIAGNOSIS — E039 Hypothyroidism, unspecified: Secondary | ICD-10-CM | POA: Insufficient documentation

## 2022-06-06 DIAGNOSIS — H2512 Age-related nuclear cataract, left eye: Secondary | ICD-10-CM | POA: Insufficient documentation

## 2022-06-06 HISTORY — PX: CATARACT EXTRACTION W/PHACO: SHX586

## 2022-06-06 SURGERY — PHACOEMULSIFICATION, CATARACT, WITH IOL INSERTION
Anesthesia: Monitor Anesthesia Care | Site: Eye | Laterality: Left

## 2022-06-06 MED ORDER — SIGHTPATH DOSE#1 BSS IO SOLN
INTRAOCULAR | Status: DC | PRN
  Administered 2022-06-06: 15 mL via INTRAOCULAR

## 2022-06-06 MED ORDER — ARMC OPHTHALMIC DILATING DROPS
1.0000 | OPHTHALMIC | Status: DC | PRN
  Administered 2022-06-06 (×3): 1 via OPHTHALMIC

## 2022-06-06 MED ORDER — MIDAZOLAM HCL 2 MG/2ML IJ SOLN
INTRAMUSCULAR | Status: DC | PRN
  Administered 2022-06-06 (×2): 1 mg via INTRAVENOUS

## 2022-06-06 MED ORDER — LACTATED RINGERS IV SOLN: INTRAVENOUS | Status: DC

## 2022-06-06 MED ORDER — SIGHTPATH DOSE#1 BSS IO SOLN
INTRAOCULAR | Status: DC | PRN
  Administered 2022-06-06: 39 mL via OPHTHALMIC

## 2022-06-06 MED ORDER — BRIMONIDINE TARTRATE-TIMOLOL 0.2-0.5 % OP SOLN
OPHTHALMIC | Status: DC | PRN
  Administered 2022-06-06: 1 [drp] via OPHTHALMIC

## 2022-06-06 MED ORDER — SIGHTPATH DOSE#1 BSS IO SOLN
INTRAOCULAR | Status: DC | PRN
  Administered 2022-06-06: 2 mL

## 2022-06-06 MED ORDER — CEFUROXIME OPHTHALMIC INJECTION 1 MG/0.1 ML
INJECTION | OPHTHALMIC | Status: DC | PRN
  Administered 2022-06-06: .1 mL via INTRACAMERAL

## 2022-06-06 MED ORDER — ONDANSETRON HCL 4 MG/2ML IJ SOLN: 4.0000 mg | Freq: Once | INTRAMUSCULAR | Status: DC | PRN

## 2022-06-06 MED ORDER — POLYMYXIN B-TRIMETHOPRIM 10000-0.1 UNIT/ML-% OP SOLN
OPHTHALMIC | Status: DC | PRN
  Administered 2022-06-06: 1 [drp] via OPHTHALMIC

## 2022-06-06 MED ORDER — FENTANYL CITRATE (PF) 100 MCG/2ML IJ SOLN
INTRAMUSCULAR | Status: DC | PRN
  Administered 2022-06-06 (×2): 50 ug via INTRAVENOUS

## 2022-06-06 MED ORDER — FENTANYL CITRATE PF 50 MCG/ML IJ SOSY: 25.0000 ug | PREFILLED_SYRINGE | INTRAMUSCULAR | Status: DC | PRN

## 2022-06-06 MED ORDER — SIGHTPATH DOSE#1 NA CHONDROIT SULF-NA HYALURON 40-17 MG/ML IO SOLN
INTRAOCULAR | Status: DC | PRN
  Administered 2022-06-06: 1 mL via INTRAOCULAR

## 2022-06-06 MED ORDER — TETRACAINE HCL 0.5 % OP SOLN
1.0000 [drp] | OPHTHALMIC | Status: DC | PRN
  Administered 2022-06-06 (×3): 1 [drp] via OPHTHALMIC

## 2022-06-06 SURGICAL SUPPLY — 12 items
CANNULA ANT/CHMB 27G (MISCELLANEOUS) IMPLANT
CANNULA ANT/CHMB 27GA (MISCELLANEOUS) IMPLANT
CATARACT SUITE SIGHTPATH (MISCELLANEOUS) ×1 IMPLANT
FEE CATARACT SUITE SIGHTPATH (MISCELLANEOUS) ×1 IMPLANT
GLOVE SURG ENC TEXT LTX SZ8 (GLOVE) ×1 IMPLANT
GLOVE SURG TRIUMPH 8.0 PF LTX (GLOVE) ×1 IMPLANT
KIT SLEEVE INFUSION .9 MICRO (MISCELLANEOUS) IMPLANT
LENS IOL TECNIS EYHANCE 23.5 (Intraocular Lens) IMPLANT
NDL FILTER BLUNT 18X1 1/2 (NEEDLE) ×1 IMPLANT
NEEDLE FILTER BLUNT 18X1 1/2 (NEEDLE) ×1 IMPLANT
SYR 3ML LL SCALE MARK (SYRINGE) ×1 IMPLANT
WATER STERILE IRR 250ML POUR (IV SOLUTION) ×1 IMPLANT

## 2022-06-06 NOTE — Op Note (Signed)
PREOPERATIVE DIAGNOSIS:  Nuclear sclerotic cataract of the left eye.   POSTOPERATIVE DIAGNOSIS:  Nuclear sclerotic cataract of the left eye.   OPERATIVE PROCEDURE:ORPROCALL@   SURGEON:  Galen Manila, MD.   ANESTHESIA:  Anesthesiologist: Yevette Edwards, MD CRNA: Domenic Moras, CRNA  1.      Managed anesthesia care. 2.     0.61ml of Shugarcaine was instilled following the paracentesis   COMPLICATIONS:  None.   TECHNIQUE:   Stop and chop   DESCRIPTION OF PROCEDURE:  The patient was examined and consented in the preoperative holding area where the aforementioned topical anesthesia was applied to the left eye and then brought back to the Operating Room where the left eye was prepped and draped in the usual sterile ophthalmic fashion and a lid speculum was placed. A paracentesis was created with the side port blade and the anterior chamber was filled with viscoelastic. A near clear corneal incision was performed with the steel keratome. A continuous curvilinear capsulorrhexis was performed with a cystotome followed by the capsulorrhexis forceps. Hydrodissection and hydrodelineation were carried out with BSS on a blunt cannula. The lens was removed in a stop and chop  technique and the remaining cortical material was removed with the irrigation-aspiration handpiece. The capsular bag was inflated with viscoelastic and the Technis [id number] lens was placed in the capsular bag without complication. The remaining viscoelastic was removed from the eye with the irrigation-aspiration handpiece. The wounds were hydrated. The anterior chamber was flushed with BSS and the eye was inflated to physiologic pressure. 0.68ml Cefuroxime was placed in the anterior chamber. The wounds were found to be water tight. The eye was dressed with Combigan. The patient was given protective glasses to wear throughout the day and a shield with which to sleep tonight. The patient was also given drops with which to begin a drop  regimen today and will follow-up with me in one day. Implant Name Type Inv. Item Serial No. Manufacturer Lot No. LRB No. Used Action  LENS IOL TECNIS EYHANCE 23.5 - [id number] Intraocular Lens LENS IOL TECNIS EYHANCE 23.5 [id number] [employer]  Left 1 Implanted    Procedure(s): CATARACT EXTRACTION PHACO AND INTRAOCULAR LENS PLACEMENT (IOC) LEFT 6.61 00:33.5 (Left)  Electronically signed: Galen Manila 06/06/2022 8:39 AM

## 2022-06-06 NOTE — Anesthesia Postprocedure Evaluation (Signed)
Anesthesia Post Note  Patient: Holly Lam  Procedure(s) Performed: CATARACT EXTRACTION PHACO AND INTRAOCULAR LENS PLACEMENT (IOC) LEFT 6.61 00:33.5 (Left: Eye)  Patient location during evaluation: PACU Anesthesia Type: MAC Level of consciousness: awake and alert Pain management: pain level controlled Vital Signs Assessment: post-procedure vital signs reviewed and stable Respiratory status: spontaneous breathing, nonlabored ventilation, respiratory function stable and patient connected to nasal cannula oxygen Cardiovascular status: stable and blood pressure returned to baseline Postop Assessment: no apparent nausea or vomiting Anesthetic complications: no   No notable events documented.   Last Vitals:  Vitals:   06/06/22 0839 06/06/22 0844  BP: (!) 133/58 122/87  Pulse: 78 69  Resp: 17 17  Temp: (!) 36.2 C (!) 36.2 C  SpO2: 99% 94%    Last Pain:  Vitals:   06/06/22 0839  TempSrc:   PainSc: 0-No pain                 Molli Barrows

## 2022-06-06 NOTE — Transfer of Care (Signed)
Immediate Anesthesia Transfer of Care Note  Patient: Holly Lam  Procedure(s) Performed: CATARACT EXTRACTION PHACO AND INTRAOCULAR LENS PLACEMENT (IOC) LEFT 6.61 00:33.5 (Left: Eye)  Patient Location: PACU  Anesthesia Type: MAC  Level of Consciousness: awake, alert  and patient cooperative  Airway and Oxygen Therapy: Patient Spontanous Breathing and Patient connected to supplemental oxygen  Post-op Assessment: Post-op Vital signs reviewed, Patient's Cardiovascular Status Stable, Respiratory Function Stable, Patent Airway and No signs of Nausea or vomiting  Post-op Vital Signs: Reviewed and stable  Complications: No notable events documented.

## 2022-06-06 NOTE — H&P (Signed)
[facility]   Primary Care Physician:  Jerl Mina, MD Ophthalmologist: Dr. Druscilla Brownie  Pre-Procedure History & Physical: HPI:  Holly Lam is a 71 y.o. female here for cataract surgery.   Past Medical History:  Diagnosis Date   Arthritis    Complication of anesthesia    Heartburn    Hyperlipidemia    Hypertension    Hypothyroidism    Left knee pain    PONV (postoperative nausea and vomiting)     Past Surgical History:  Procedure Laterality Date   ABDOMINAL HYSTERECTOMY     total- painful periods   BREAST BIOPSY Left 1973   clogged milk duct   CHOLECYSTECTOMY  1980   ESOPHAGOGASTRODUODENOSCOPY (EGD) WITH PROPOFOL N/A 08/22/2019   Procedure: ESOPHAGOGASTRODUODENOSCOPY (EGD) WITH PROPOFOL;  Surgeon: Midge Minium, MD;  Location: [facility] ENDOSCOPY;  Service: Endoscopy;  Laterality: N/A;   INCONTINENCE SURGERY  2012   also cystocele and rectocele   leg vein surgery Right 06/2016   legsurgery     NASAL SINUS SURGERY  2002   REPLACEMENT TOTAL KNEE Left 2012   REPLACEMENT TOTAL KNEE BILATERAL     THYROID SURGERY  2002   ULNAR NERVE TRANSPOSITION Left 2007    Prior to Admission medications   Medication Sig Start Date End Date Taking? Authorizing Provider  acetaminophen (TYLENOL) 500 MG tablet Take 1,000 mg by mouth every 6 (six) hours as needed.   Yes [provider]  bisacodyl (DULCOLAX) 5 MG EC tablet Take 5 mg by mouth daily as needed for moderate constipation.   Yes [provider]  CALCIUM-CHOLECALCIFEROL-ZINC PO Take by mouth 2 (two) times daily.   Yes [provider]  cetirizine (ZYRTEC) 10 MG chewable tablet Chew 10 mg by mouth daily.   Yes [provider]  fexofenadine (ALLEGRA) 180 MG tablet Take 180 mg by mouth daily as needed.   Yes [provider]  ibuprofen (ADVIL) 200 MG tablet Take 400 mg by mouth every 6 (six) hours as needed.   Yes [provider]  Light Mineral Oil-Mineral Oil (RETAINE MGD) 0.5-0.5 %  EMUL Apply to eye.   Yes [provider]  MEGARED OMEGA-3 KRILL OIL PO  03/10/16  Yes [provider]  olmesartan (BENICAR) 40 MG tablet  02/01/17  Yes [provider]  pantoprazole (PROTONIX) 40 MG tablet TAKE 1 TABLET DAILY 01/19/20  Yes Midge Minium, MD  Phenylephrine-APAP-guaiFENesin (MUCINEX SINUS-MAX PO) Take by mouth as needed.   Yes [provider]  pregabalin (LYRICA) 50 MG capsule Take 50 mg by mouth 2 (two) times daily.   Yes [provider]  Probiotic Product (ALIGN PO) Take by mouth daily.   Yes [provider]  senna (SENOKOT) 8.6 MG tablet Take 2 tablets by mouth daily as needed for constipation.   Yes [provider]  SYNTHROID 50 MCG tablet  02/24/17  Yes [provider]  venlafaxine XR (EFFEXOR-XR) 150 MG 24 hr capsule  02/24/17  Yes [provider]  VICTOZA 18 MG/3ML SOPN  02/12/17  Yes [provider]  vitamin C (ASCORBIC ACID) 250 MG tablet Take 250 mg by mouth 2 (two) times daily.   Yes [provider]    Allergies as of 05/10/2022 - Review Complete 08/22/2019  Allergen Reaction Noted   Tape Rash    Levofloxacin Nausea Only 08/03/2015   Moxifloxacin Nausea Only 08/03/2015   Prednisone  08/03/2015    Family History  Problem Relation Age of Onset   Prostate  cancer Brother    Heart attack Brother    Diabetes Father    Heart attack Father    Colon cancer Paternal Aunt    Heart attack Son    COPD Son    Obesity Son    Stroke Mother    Diabetes Mellitus II Mother    Heart disease Mother    Kidney failure Mother    Breast cancer Neg Hx    Kidney cancer Neg Hx    Bladder Cancer Neg Hx    Ovarian cancer Neg Hx     Social History   Socioeconomic History   Marital status: Married    Spouse name: Not on file   Number of children: Not on file   Years of education: Not on file   Highest education level: Not on file  Occupational History   Not on file  Tobacco Use    Smoking status: Never   Smokeless tobacco: Never  Vaping Use   Vaping Use: Never used  Substance and Sexual Activity   Alcohol use: Yes    Comment: 2 drinks every 6 months   Drug use: No   Sexual activity: Yes    Birth control/protection: Surgical  Other Topics Concern   Not on file  Social History Narrative   Not on file   Social Determinants of Health   Financial Resource Strain: Not on file  Food Insecurity: Not on file  Transportation Needs: Not on file  Physical Activity: Not on file  Stress: Not on file  Social Connections: Not on file  Intimate Partner Violence: Not on file    Review of Systems: See HPI, otherwise negative ROS  Physical Exam: BP 133/70   Pulse 74   Temp 97.9 F (36.6 C) (Temporal)   Resp 20   Ht 5\' 5"  (1.651 m)   Wt 105.2 kg   SpO2 99%   BMI 38.61 kg/m  General:   Alert, cooperative in NAD Head:  Normocephalic and atraumatic. Respiratory:  Normal work of breathing. Cardiovascular:  RRR  Impression/Plan: Holly Lam is here for cataract surgery.  Risks, benefits, limitations, and alternatives regarding cataract surgery have been reviewed with the patient.  Questions have been answered.  All parties agreeable.   Wilmer Floor, MD  06/06/2022, 7:51 AM

## 2022-06-06 NOTE — Anesthesia Preprocedure Evaluation (Signed)
Anesthesia Evaluation  Patient identified by MRN, date of birth, ID band Patient awake    Reviewed: Allergy & Precautions, H&P , NPO status , Patient's Chart, lab work & pertinent test results, reviewed documented beta blocker date and time   History of Anesthesia Complications (+) PONV and history of anesthetic complications  Airway Mallampati: II  TM Distance: >3 FB Neck ROM: full    Dental no notable dental hx. (+) Teeth Intact   Pulmonary neg pulmonary ROS   Pulmonary exam normal breath sounds clear to auscultation       Cardiovascular Exercise Tolerance: Good hypertension, On Medications negative cardio ROS  Rhythm:regular Rate:Normal     Neuro/Psych  PSYCHIATRIC DISORDERS  Depression    negative neurological ROS     GI/Hepatic Neg liver ROS,GERD  Medicated,,  Endo/Other  diabetes, Well ControlledHypothyroidism    Renal/GU      Musculoskeletal   Abdominal   Peds  Hematology negative hematology ROS (+)   Anesthesia Other Findings   Reproductive/Obstetrics negative OB ROS                             Anesthesia Physical Anesthesia Plan  ASA: 3  Anesthesia Plan: MAC   Post-op Pain Management:    Induction:   PONV Risk Score and Plan:   Airway Management Planned:   Additional Equipment:   Intra-op Plan:   Post-operative Plan:   Informed Consent: I have reviewed the patients History and Physical, chart, labs and discussed the procedure including the risks, benefits and alternatives for the proposed anesthesia with the patient or authorized representative who has indicated his/her understanding and acceptance.       Plan Discussed with: CRNA  Anesthesia Plan Comments:        Anesthesia Quick Evaluation

## 2022-06-07 ENCOUNTER — Encounter: Payer: Self-pay | Admitting: Ophthalmology

## 2022-06-14 ENCOUNTER — Encounter: Payer: Self-pay | Admitting: Ophthalmology

## 2022-06-15 NOTE — Discharge Instructions (Signed)

## 2022-06-20 ENCOUNTER — Ambulatory Visit: Payer: Medicare Other | Admitting: Anesthesiology

## 2022-06-20 ENCOUNTER — Encounter
Admission: RE | Disposition: A | Discharge status: Home / Self Care | Payer: Self-pay | Source: Ambulatory Visit | Attending: Ophthalmology

## 2022-06-20 ENCOUNTER — Ambulatory Visit
Admission: RE | Admit: 2022-06-20 | Discharge: 2022-06-20 | Disposition: A | Discharge status: Home / Self Care | Payer: Medicare Other | Source: Ambulatory Visit | Attending: Ophthalmology | Admitting: Ophthalmology

## 2022-06-20 ENCOUNTER — Encounter: Payer: Self-pay | Admitting: Ophthalmology

## 2022-06-20 ENCOUNTER — Other Ambulatory Visit: Payer: Self-pay

## 2022-06-20 DIAGNOSIS — F32A Depression, unspecified: Secondary | ICD-10-CM | POA: Diagnosis not present

## 2022-06-20 DIAGNOSIS — K219 Gastro-esophageal reflux disease without esophagitis: Secondary | ICD-10-CM | POA: Insufficient documentation

## 2022-06-20 DIAGNOSIS — Z87891 Personal history of nicotine dependence: Secondary | ICD-10-CM | POA: Diagnosis not present

## 2022-06-20 DIAGNOSIS — H2512 Age-related nuclear cataract, left eye: Secondary | ICD-10-CM | POA: Insufficient documentation

## 2022-06-20 DIAGNOSIS — E1136 Type 2 diabetes mellitus with diabetic cataract: Secondary | ICD-10-CM | POA: Diagnosis not present

## 2022-06-20 DIAGNOSIS — E039 Hypothyroidism, unspecified: Secondary | ICD-10-CM | POA: Diagnosis not present

## 2022-06-20 DIAGNOSIS — Z79890 Hormone replacement therapy (postmenopausal): Secondary | ICD-10-CM | POA: Insufficient documentation

## 2022-06-20 DIAGNOSIS — E785 Hyperlipidemia, unspecified: Secondary | ICD-10-CM | POA: Diagnosis not present

## 2022-06-20 DIAGNOSIS — I10 Essential (primary) hypertension: Secondary | ICD-10-CM | POA: Diagnosis not present

## 2022-06-20 HISTORY — PX: CATARACT EXTRACTION W/PHACO: SHX586

## 2022-06-20 SURGERY — PHACOEMULSIFICATION, CATARACT, WITH IOL INSERTION
Anesthesia: Monitor Anesthesia Care | Site: Eye | Laterality: Right

## 2022-06-20 MED ORDER — BRIMONIDINE TARTRATE-TIMOLOL 0.2-0.5 % OP SOLN
OPHTHALMIC | Status: DC | PRN
  Administered 2022-06-20: 1 [drp] via OPHTHALMIC

## 2022-06-20 MED ORDER — SIGHTPATH DOSE#1 BSS IO SOLN
INTRAOCULAR | Status: DC | PRN
  Administered 2022-06-20: 15 mL via INTRAOCULAR

## 2022-06-20 MED ORDER — ARMC OPHTHALMIC DILATING DROPS
1.0000 | OPHTHALMIC | Status: DC | PRN
  Administered 2022-06-20 (×3): 1 via OPHTHALMIC

## 2022-06-20 MED ORDER — SIGHTPATH DOSE#1 NA CHONDROIT SULF-NA HYALURON 40-17 MG/ML IO SOLN
INTRAOCULAR | Status: DC | PRN
  Administered 2022-06-20: 1 mL via INTRAOCULAR

## 2022-06-20 MED ORDER — MIDAZOLAM HCL 2 MG/2ML IJ SOLN
INTRAMUSCULAR | Status: DC | PRN
  Administered 2022-06-20: 1.5 mg via INTRAVENOUS

## 2022-06-20 MED ORDER — SIGHTPATH DOSE#1 BSS IO SOLN
INTRAOCULAR | Status: DC | PRN
  Administered 2022-06-20: 2 mL

## 2022-06-20 MED ORDER — SIGHTPATH DOSE#1 BSS IO SOLN
INTRAOCULAR | Status: DC | PRN
  Administered 2022-06-20: 39 mL via OPHTHALMIC

## 2022-06-20 MED ORDER — TETRACAINE HCL 0.5 % OP SOLN
1.0000 [drp] | OPHTHALMIC | Status: DC | PRN
  Administered 2022-06-20 (×3): 1 [drp] via OPHTHALMIC

## 2022-06-20 MED ORDER — SODIUM CHLORIDE 0.9% FLUSH
INTRAVENOUS | Status: DC | PRN
  Administered 2022-06-20: 10 mL via INTRAVENOUS

## 2022-06-20 MED ORDER — CEFUROXIME OPHTHALMIC INJECTION 1 MG/0.1 ML
INJECTION | OPHTHALMIC | Status: DC | PRN
  Administered 2022-06-20: .1 mL via INTRACAMERAL

## 2022-06-20 MED ORDER — FENTANYL CITRATE (PF) 100 MCG/2ML IJ SOLN
INTRAMUSCULAR | Status: DC | PRN
  Administered 2022-06-20: 50 ug via INTRAVENOUS

## 2022-06-20 SURGICAL SUPPLY — 11 items
CANNULA ANT/CHMB 27G (MISCELLANEOUS) IMPLANT
CANNULA ANT/CHMB 27GA (MISCELLANEOUS) IMPLANT
CATARACT SUITE SIGHTPATH (MISCELLANEOUS) ×1 IMPLANT
FEE CATARACT SUITE SIGHTPATH (MISCELLANEOUS) ×1 IMPLANT
GLOVE SURG ENC TEXT LTX SZ8 (GLOVE) ×1 IMPLANT
GLOVE SURG TRIUMPH 8.0 PF LTX (GLOVE) ×1 IMPLANT
LENS IOL TECNIS EYHANCE 23.0 (Intraocular Lens) IMPLANT
NDL FILTER BLUNT 18X1 1/2 (NEEDLE) ×1 IMPLANT
NEEDLE FILTER BLUNT 18X1 1/2 (NEEDLE) ×1 IMPLANT
SYR 3ML LL SCALE MARK (SYRINGE) ×1 IMPLANT
WATER STERILE IRR 250ML POUR (IV SOLUTION) ×1 IMPLANT

## 2022-06-20 NOTE — Anesthesia Procedure Notes (Signed)
Procedure Name: MAC Date/Time: 06/20/2022 11:23 AM  Performed by: Candice Camp, CRNAPre-anesthesia Checklist: Patient identified, Emergency Drugs available, Suction available, Patient being monitored and Timeout performed Patient Re-evaluated:Patient Re-evaluated prior to induction Oxygen Delivery Method: Nasal cannula Preoxygenation: Pre-oxygenation with 100% oxygen Induction Type: IV induction

## 2022-06-20 NOTE — H&P (Signed)
[place]   Primary Care Physician:  Maryland Pink, MD Ophthalmologist: Dr.Meiling Hendriks  Pre-Procedure History & Physical: HPI:  Holly Lam is a 71 y.o. female here for cataract surgery.   Past Medical History:  Diagnosis Date   Arthritis    Complication of anesthesia    Heartburn    Hyperlipidemia    Hypertension    Hypothyroidism    Left knee pain    PONV (postoperative nausea and vomiting)     Past Surgical History:  Procedure Laterality Date   ABDOMINAL HYSTERECTOMY     total- painful periods   BREAST BIOPSY Left 1973   clogged milk duct   CATARACT EXTRACTION W/PHACO Left 06/06/2022   Procedure: CATARACT EXTRACTION PHACO AND INTRAOCULAR LENS PLACEMENT (IOC) LEFT 6.61 00:33.5;  Surgeon: Birder Robson, MD;  Location: [facility];  Service: Ophthalmology;  Laterality: Left;   CHOLECYSTECTOMY  1980   ESOPHAGOGASTRODUODENOSCOPY (EGD) WITH PROPOFOL N/A 08/22/2019   Procedure: ESOPHAGOGASTRODUODENOSCOPY (EGD) WITH PROPOFOL;  Surgeon: Lucilla Lame, MD;  Location: [facility] ENDOSCOPY;  Service: Endoscopy;  Laterality: N/A;   INCONTINENCE SURGERY  2012   also cystocele and rectocele   leg vein surgery Right 06/2016   legsurgery     NASAL SINUS SURGERY  2002   REPLACEMENT TOTAL KNEE Left 2012   REPLACEMENT TOTAL KNEE BILATERAL     THYROID SURGERY  2002   ULNAR NERVE TRANSPOSITION Left 2007    Prior to Admission medications   Medication Sig Start Date End Date Taking? Authorizing Provider  acetaminophen (TYLENOL) 500 MG tablet Take 1,000 mg by mouth every 6 (six) hours as needed.   Yes [provider]  bisacodyl (DULCOLAX) 5 MG EC tablet Take 5 mg by mouth daily as needed for moderate constipation.   Yes [provider]  CALCIUM-CHOLECALCIFEROL-ZINC PO Take by mouth 2 (two) times daily.   Yes [provider]  cetirizine (ZYRTEC) 10 MG chewable tablet Chew 10 mg by mouth daily.   Yes [provider]  fexofenadine (ALLEGRA) 180  MG tablet Take 180 mg by mouth daily as needed.   Yes [provider]  ibuprofen (ADVIL) 200 MG tablet Take 400 mg by mouth every 6 (six) hours as needed.   Yes [provider]  Light Mineral Oil-Mineral Oil (RETAINE MGD) 0.5-0.5 % EMUL Apply to eye.   Yes [provider]  MEGARED OMEGA-3 KRILL OIL PO  03/10/16  Yes [provider]  olmesartan (BENICAR) 40 MG tablet  02/01/17  Yes [provider]  Phenylephrine-APAP-guaiFENesin ([place] SINUS-MAX PO) Take by mouth as needed.   Yes [provider]  pregabalin (LYRICA) 50 MG capsule Take 50 mg by mouth 2 (two) times daily.   Yes [provider]  Probiotic Product (ALIGN PO) Take by mouth daily.   Yes [provider]  senna (SENOKOT) 8.6 MG tablet Take 2 tablets by mouth daily as needed for constipation.   Yes [provider]  SYNTHROID 50 MCG tablet  02/24/17  Yes [provider]  venlafaxine XR (EFFEXOR-XR) 150 MG 24 hr capsule  02/24/17  Yes [provider]  VICTOZA 18 MG/3ML SOPN  02/12/17  Yes [provider]  vitamin C (ASCORBIC ACID) 250 MG tablet Take 250 mg by mouth 2 (two) times daily.   Yes [provider]  pantoprazole (PROTONIX) 40 MG tablet TAKE 1 TABLET DAILY Patient not taking: Reported on 06/20/2022 01/19/20   Lucilla Lame, MD    Allergies as of 05/11/2022 - Review  Complete 08/22/2019  Allergen Reaction Noted   Tape Rash    Levofloxacin Nausea Only 08/03/2015   Moxifloxacin Nausea Only 08/03/2015   Prednisone  08/03/2015    Family History  Problem Relation Age of Onset   Prostate cancer Brother    Heart attack Brother    Diabetes Father    Heart attack Father    Colon cancer Paternal Aunt    Heart attack Son    COPD Son    Obesity Son    Stroke Mother    Diabetes Mellitus II Mother    Heart disease Mother    Kidney failure Mother    Breast cancer Neg Hx    Kidney cancer Neg Hx    Bladder Cancer Neg Hx     Ovarian cancer Neg Hx     Social History   Socioeconomic History   Marital status: Married    Spouse name: Not on file   Number of children: Not on file   Years of education: Not on file   Highest education level: Not on file  Occupational History   Not on file  Tobacco Use   Smoking status: Never   Smokeless tobacco: Never  Vaping Use   Vaping Use: Never used  Substance and Sexual Activity   Alcohol use: Yes    Comment: 2 drinks every 6 months   Drug use: No   Sexual activity: Yes    Birth control/protection: Surgical  Other Topics Concern   Not on file  Social History Narrative   Not on file   Social Determinants of Health   Financial Resource Strain: Not on file  Food Insecurity: Not on file  Transportation Needs: Not on file  Physical Activity: Not on file  Stress: Not on file  Social Connections: Not on file  Intimate Partner Violence: Not on file    Review of Systems: See HPI, otherwise negative ROS  Physical Exam: BP (!) 150/83   Temp (!) 97.3 F (36.3 C) (Tympanic)   Ht 5\' 5"  (1.651 m)   Wt 106.6 kg   SpO2 100%   BMI 39.11 kg/m  General:   Alert, cooperative in NAD Head:  Normocephalic and atraumatic. Respiratory:  Normal work of breathing. Cardiovascular:  RRR  Impression/Plan: Holly Lam is here for cataract surgery.  Risks, benefits, limitations, and alternatives regarding cataract surgery have been reviewed with the patient.  Questions have been answered.  All parties agreeable.   Wilmer Floor, MD  06/20/2022, 11:05 AM

## 2022-06-20 NOTE — Anesthesia Postprocedure Evaluation (Signed)
Anesthesia Post Note  Patient: Holly Lam  Procedure(s) Performed: CATARACT EXTRACTION PHACO AND INTRAOCULAR LENS PLACEMENT (IOC) RIGHT 8.40 00:42.4 (Right: Eye)  Patient location during evaluation: PACU Anesthesia Type: MAC Level of consciousness: awake and alert Pain management: pain level controlled Vital Signs Assessment: post-procedure vital signs reviewed and stable Respiratory status: spontaneous breathing, nonlabored ventilation, respiratory function stable and patient connected to nasal cannula oxygen Cardiovascular status: stable and blood pressure returned to baseline Postop Assessment: no apparent nausea or vomiting Anesthetic complications: no   No notable events documented.   Last Vitals:  Vitals:   06/20/22 1138 06/20/22 1146  BP: (!) 141/65 136/64  Pulse: 65 63  Resp: 18 (!) 21  Temp: 36.7 C   SpO2: 98% 95%    Last Pain:  Vitals:   06/20/22 1146  TempSrc:   PainSc: 0-No pain                 Arita Miss

## 2022-06-20 NOTE — Anesthesia Preprocedure Evaluation (Signed)
Anesthesia Evaluation  Patient identified by MRN, date of birth, ID band Patient awake    Reviewed: Allergy & Precautions, H&P , NPO status , Patient's Chart, lab work & pertinent test results, reviewed documented beta blocker date and time   History of Anesthesia Complications (+) PONV and history of anesthetic complications  Airway Mallampati: II  TM Distance: >3 FB Neck ROM: full    Dental no notable dental hx. (+) Teeth Intact   Pulmonary neg pulmonary ROS, neg sleep apnea, neg COPD, Patient abstained from smoking.Not current smoker   Pulmonary exam normal breath sounds clear to auscultation       Cardiovascular Exercise Tolerance: Good METShypertension, On Medications (-) CAD and (-) Past MI (-) dysrhythmias  Rhythm:regular Rate:Normal     Neuro/Psych  PSYCHIATRIC DISORDERS  Depression       GI/Hepatic Neg liver ROS,GERD  Medicated,,  Endo/Other  diabetes, Well ControlledHypothyroidism    Renal/GU negative Renal ROS     Musculoskeletal   Abdominal   Peds  Hematology negative hematology ROS (+)   Anesthesia Other Findings Past Medical History: No date: Arthritis No date: Complication of anesthesia No date: Heartburn No date: Hyperlipidemia No date: Hypertension No date: Hypothyroidism No date: Left knee pain No date: PONV (postoperative nausea and vomiting)  Reproductive/Obstetrics negative OB ROS                              Anesthesia Physical Anesthesia Plan  ASA: 3  Anesthesia Plan: MAC   Post-op Pain Management:    Induction: Intravenous  PONV Risk Score and Plan: 2 and Midazolam  Airway Management Planned: Nasal Cannula  Additional Equipment:   Intra-op Plan:   Post-operative Plan:   Informed Consent: I have reviewed the patients History and Physical, chart, labs and discussed the procedure including the risks, benefits and alternatives for the proposed  anesthesia with the patient or authorized representative who has indicated his/her understanding and acceptance.       Plan Discussed with: CRNA  Anesthesia Plan Comments: (Explained risks of anesthesia, including PONV, and rare emergencies such as cardiac events, respiratory problems, and allergic reactions, requiring invasive intervention. Discussed the role of CRNA in patient's perioperative care. Patient understands. )        Anesthesia Quick Evaluation

## 2022-06-20 NOTE — Transfer of Care (Signed)
Immediate Anesthesia Transfer of Care Note  Patient: Holly Lam  Procedure(s) Performed: CATARACT EXTRACTION PHACO AND INTRAOCULAR LENS PLACEMENT (IOC) RIGHT 8.40 00:42.4 (Right: Eye)  Patient Location: PACU  Anesthesia Type: MAC  Level of Consciousness: awake, alert  and patient cooperative  Airway and Oxygen Therapy: Patient Spontanous Breathing and Patient connected to supplemental oxygen  Post-op Assessment: Post-op Vital signs reviewed, Patient's Cardiovascular Status Stable, Respiratory Function Stable, Patent Airway and No signs of Nausea or vomiting  Post-op Vital Signs: Reviewed and stable  Complications: No notable events documented.

## 2022-06-20 NOTE — Op Note (Signed)
PREOPERATIVE DIAGNOSIS:  Nuclear sclerotic cataract of the left eye.   POSTOPERATIVE DIAGNOSIS:  CATARACT   OPERATIVE PROCEDURE:  Procedure(s): CATARACT EXTRACTION PHACO AND INTRAOCULAR LENS PLACEMENT (IOC) RIGHT 8.40 00:42.4   SURGEON:  Galen Manila, MD.   ANESTHESIA:   Anesthesiologist: Corinda Gubler, MD CRNA: Cecile Hearing, CRNA  1.      Managed anesthesia care. 2.      Topical tetracaine drops followed by 2% Xylocaine jelly applied in the preoperative holding area.   COMPLICATIONS:  None.   TECHNIQUE:   Stop and chop   DESCRIPTION OF PROCEDURE:  The patient was examined and consented in the preoperative holding area where the aforementioned topical anesthesia was applied to the left eye and then brought back to the Operating Room where the left eye was prepped and draped in the usual sterile ophthalmic fashion and a lid speculum was placed. A paracentesis was created with the side port blade and the anterior chamber was filled with viscoelastic. A near clear corneal incision was performed with the steel keratome. A continuous curvilinear capsulorrhexis was performed with a cystotome followed by the capsulorrhexis forceps. Hydrodissection and hydrodelineation were carried out with BSS on a blunt cannula. The lens was removed in a stop and chop  technique and the remaining cortical material was removed with the irrigation-aspiration handpiece. The capsular bag was inflated with viscoelastic and the Technis [id number] lens was placed in the capsular bag without complication. The remaining viscoelastic was removed from the eye with the irrigation-aspiration handpiece. The wounds were hydrated. The anterior chamber was flushed with BSS and the eye was inflated to physiologic pressure. 0.1 mL of cefuroxime concentration 10 mg/mL was placed in the anterior chamber. The wounds were found to be water tight. The eye was dressed with Combigan. The patient was given protective glasses to wear throughout  the day and a shield with which to sleep tonight. The patient was also given drops with which to begin a drop regimen today and will follow-up with me in one day. Implant Name Type Inv. Item Serial No. Manufacturer Lot No. LRB No. Used Action  LENS IOL TECNIS EYHANCE 23.0 - [id number] Intraocular Lens LENS IOL TECNIS EYHANCE 23.0 [id number] [employer]  Right 1 Implanted   Procedure(s): CATARACT EXTRACTION PHACO AND INTRAOCULAR LENS PLACEMENT (IOC) RIGHT 8.40 00:42.4 (Right)  Electronically signed: Galen Manila 06/20/2022 11:36 AM

## 2022-06-21 ENCOUNTER — Encounter: Payer: Self-pay | Admitting: Ophthalmology

## 2022-07-31 ENCOUNTER — Ambulatory Visit (INDEPENDENT_AMBULATORY_CARE_PROVIDER_SITE_OTHER): Payer: Medicare Other | Admitting: Urology

## 2022-07-31 ENCOUNTER — Encounter: Payer: Self-pay | Admitting: Urology

## 2022-07-31 VITALS — BP 114/66 | HR 80 | Ht 65.0 in | Wt 230.0 lb

## 2022-07-31 DIAGNOSIS — Z87440 Personal history of urinary (tract) infections: Secondary | ICD-10-CM

## 2022-07-31 LAB — MICROSCOPIC EXAMINATION

## 2022-07-31 LAB — URINALYSIS, COMPLETE
Bilirubin, UA: NEGATIVE
Glucose, UA: NEGATIVE
Ketones, UA: NEGATIVE
Nitrite, UA: NEGATIVE
Protein,UA: NEGATIVE
RBC, UA: NEGATIVE
Specific Gravity, UA: 1.025 (ref 1.005–1.030)
Urobilinogen, Ur: 0.2 mg/dL (ref 0.2–1.0)
pH, UA: 5 (ref 5.0–7.5)

## 2022-07-31 LAB — BLADDER SCAN AMB NON-IMAGING

## 2022-07-31 NOTE — Progress Notes (Signed)
07/31/2022 8:22 AM   Holly Lam 06/03/1951 [id number]  Referring provider: Maryland Pink, MD [address] [facility] [place],  [place] [address]  Chief Complaint  Patient presents with   Urinary Tract Infection    HPI: I was consulted to assess the patient's recent bladder infection with E. coli.  She had bladder infections many years ago when she was seen here.  It took a while to clear up the infection.  Asymptomatic now.  Voids every 2 or 3 hours.  Sometimes has some dampness of urine while sleeping.  Otherwise continent.  No nocturia.  Had a bladder sling years ago.  Has had a hysterectomy.  Prone to constipation  No neurologic issues.   PMH: Past Medical History:  Diagnosis Date   Arthritis    Complication of anesthesia    Heartburn    Hyperlipidemia    Hypertension    Hypothyroidism    Left knee pain    PONV (postoperative nausea and vomiting)     Surgical History: Past Surgical History:  Procedure Laterality Date   ABDOMINAL HYSTERECTOMY     total- painful periods   BREAST BIOPSY Left 1973   clogged milk duct   CATARACT EXTRACTION W/PHACO Left 06/06/2022   Procedure: CATARACT EXTRACTION PHACO AND INTRAOCULAR LENS PLACEMENT (IOC) LEFT 6.61 00:33.5;  Surgeon: Birder Robson, MD;  Location: [place];  Service: Ophthalmology;  Laterality: Left;   CATARACT EXTRACTION W/PHACO Right 06/20/2022   Procedure: CATARACT EXTRACTION PHACO AND INTRAOCULAR LENS PLACEMENT (IOC) RIGHT 8.40 00:42.4;  Surgeon: Birder Robson, MD;  Location: [place];  Service: Ophthalmology;  Laterality: Right;   CHOLECYSTECTOMY  1980   ESOPHAGOGASTRODUODENOSCOPY (EGD) WITH PROPOFOL N/A 08/22/2019   Procedure: ESOPHAGOGASTRODUODENOSCOPY (EGD) WITH PROPOFOL;  Surgeon: Lucilla Lame, MD;  Location: [facility];  Service: Endoscopy;  Laterality: N/A;   INCONTINENCE SURGERY  2012   also cystocele and rectocele   leg vein surgery Right 06/2016   legsurgery      NASAL SINUS SURGERY  2002   REPLACEMENT TOTAL KNEE Left 2012   REPLACEMENT TOTAL KNEE BILATERAL     THYROID SURGERY  2002   ULNAR NERVE TRANSPOSITION Left 2007    Home Medications:  Allergies as of 07/31/2022       Reactions   Tape Rash   Levofloxacin Nausea Only   Moxifloxacin Nausea Only   Prednisone    Other reaction(s): Other (See Comments) Sensitivity        Medication List        Accurate as of July 31, 2022  8:22 AM. If you have any questions, ask your nurse or doctor.          acetaminophen 500 MG tablet Commonly known as: TYLENOL Take 1,000 mg by mouth every 6 (six) hours as needed.   ALIGN PO Take by mouth daily.   bisacodyl 5 MG EC tablet Commonly known as: DULCOLAX Take 5 mg by mouth daily as needed for moderate constipation.   CALCIUM-CHOLECALCIFEROL-ZINC PO Take by mouth 2 (two) times daily.   cetirizine 10 MG chewable tablet Commonly known as: ZYRTEC Chew 10 mg by mouth daily.   fexofenadine 180 MG tablet Commonly known as: ALLEGRA Take 180 mg by mouth daily as needed.   ibuprofen 200 MG tablet Commonly known as: ADVIL Take 400 mg by mouth every 6 (six) hours as needed.   MEGARED OMEGA-3 KRILL OIL PO   MUCINEX SINUS-MAX PO Take by mouth as needed.   olmesartan 40  MG tablet Commonly known as: BENICAR   pantoprazole 40 MG tablet Commonly known as: PROTONIX TAKE 1 TABLET DAILY   pregabalin 50 MG capsule Commonly known as: LYRICA Take 50 mg by mouth 2 (two) times daily.   Retaine MGD 0.5-0.5 % Emul Generic drug: Light Mineral Oil-Mineral Oil Apply to eye.   senna 8.6 MG tablet Commonly known as: SENOKOT Take 2 tablets by mouth daily as needed for constipation.   Synthroid 50 MCG tablet Generic drug: levothyroxine   venlafaxine XR 150 MG 24 hr capsule Commonly known as: EFFEXOR-XR   Victoza 18 MG/3ML Sopn Generic drug: liraglutide   vitamin C 250 MG tablet Commonly known as: ASCORBIC ACID Take 250 mg by mouth  2 (two) times daily.        Allergies:  Allergies  Allergen Reactions   Tape Rash   Levofloxacin Nausea Only   Moxifloxacin Nausea Only   Prednisone     Other reaction(s): Other (See Comments) Sensitivity    Family History: Family History  Problem Relation Age of Onset   Prostate cancer Brother    Heart attack Brother    Diabetes Father    Heart attack Father    Colon cancer Paternal Aunt    Heart attack Son    COPD Son    Obesity Son    Stroke Mother    Diabetes Mellitus II Mother    Heart disease Mother    Kidney failure Mother    Breast cancer Neg Hx    Kidney cancer Neg Hx    Bladder Cancer Neg Hx    Ovarian cancer Neg Hx     Social History:  reports that she has never smoked. She has never used smokeless tobacco. She reports current alcohol use. She reports that she does not use drugs.  ROS:                                        Physical Exam: There were no vitals taken for this visit.  Constitutional:  Alert and oriented, No acute distress. HEENT: [place] AT, moist mucus membranes.  Trachea midline, no masses.   Laboratory Data: No results found for: "WBC", "HGB", "HCT", "MCV", "PLT"  Lab Results  Component Value Date   CREATININE 0.76 03/13/2018    No results found for: "PSA"  No results found for: "TESTOSTERONE"  No results found for: "HGBA1C"  Urinalysis    Component Value Date/Time   APPEARANCEUR Clear 10/05/2016 1010   GLUCOSEU Negative 10/05/2016 1010   BILIRUBINUR Negative 10/05/2016 1010   PROTEINUR Negative 10/05/2016 1010   NITRITE Negative 10/05/2016 1010   LEUKOCYTESUR Trace (A) 10/05/2016 1010    Pertinent Imaging: Urine reviewed.  Urine sent for culture.  Chart reviewed.  Postvoid residual 0 mL  Assessment & Plan: Patient had 1 recent bladder infection.  Call if culture positive.  Has minimal voiding dysfunction.  Role of renal ultrasound discussed as optional test.  She did have an MRI and had some  parapelvic cyst and this was discussed.  I will see her as needed.  Reassurance given  1. History of UTI  - Urinalysis, Complete   No follow-ups on file.  Reece Packer, MD  [facility] [address], [place], [place] [address] [phone]

## 2022-08-04 LAB — CULTURE, URINE COMPREHENSIVE

## 2022-11-15 ENCOUNTER — Other Ambulatory Visit: Payer: Self-pay | Admitting: Family Medicine

## 2022-11-15 DIAGNOSIS — Z1231 Encounter for screening mammogram for malignant neoplasm of breast: Secondary | ICD-10-CM

## 2022-11-16 ENCOUNTER — Ambulatory Visit
Admission: RE | Admit: 2022-11-16 | Discharge: 2022-11-16 | Disposition: A | Discharge status: Home / Self Care | Payer: Medicare Other | Source: Ambulatory Visit | Attending: Family Medicine | Admitting: Family Medicine

## 2022-11-16 DIAGNOSIS — Z1231 Encounter for screening mammogram for malignant neoplasm of breast: Secondary | ICD-10-CM | POA: Insufficient documentation

## 2023-10-04 ENCOUNTER — Other Ambulatory Visit: Payer: Self-pay | Admitting: Family Medicine

## 2023-10-04 DIAGNOSIS — Z1231 Encounter for screening mammogram for malignant neoplasm of breast: Secondary | ICD-10-CM

## 2023-11-15 ENCOUNTER — Encounter (HOSPITAL_COMMUNITY): Payer: Self-pay

## 2023-11-22 ENCOUNTER — Ambulatory Visit
Admission: RE | Admit: 2023-11-22 | Discharge: 2023-11-22 | Disposition: A | Discharge status: Home / Self Care | Source: Ambulatory Visit | Attending: Family Medicine | Admitting: Family Medicine

## 2023-11-22 DIAGNOSIS — Z1231 Encounter for screening mammogram for malignant neoplasm of breast: Secondary | ICD-10-CM | POA: Insufficient documentation

## 2023-11-29 ENCOUNTER — Other Ambulatory Visit: Payer: Self-pay | Admitting: Family Medicine

## 2023-11-29 DIAGNOSIS — R928 Other abnormal and inconclusive findings on diagnostic imaging of breast: Secondary | ICD-10-CM

## 2023-12-05 ENCOUNTER — Ambulatory Visit
Admission: RE | Admit: 2023-12-05 | Discharge: 2023-12-05 | Disposition: A | Discharge status: Home / Self Care | Source: Ambulatory Visit | Attending: Family Medicine | Admitting: Family Medicine

## 2023-12-05 DIAGNOSIS — R928 Other abnormal and inconclusive findings on diagnostic imaging of breast: Secondary | ICD-10-CM | POA: Diagnosis present

## 2023-12-07 ENCOUNTER — Other Ambulatory Visit: Payer: Self-pay | Admitting: Family Medicine

## 2023-12-07 DIAGNOSIS — R928 Other abnormal and inconclusive findings on diagnostic imaging of breast: Secondary | ICD-10-CM

## 2023-12-11 ENCOUNTER — Ambulatory Visit
Admission: RE | Admit: 2023-12-11 | Discharge: 2023-12-11 | Disposition: A | Discharge status: Home / Self Care | Source: Ambulatory Visit | Attending: Family Medicine | Admitting: Family Medicine

## 2023-12-11 DIAGNOSIS — N6002 Solitary cyst of left breast: Secondary | ICD-10-CM | POA: Diagnosis present

## 2023-12-11 DIAGNOSIS — R928 Other abnormal and inconclusive findings on diagnostic imaging of breast: Secondary | ICD-10-CM | POA: Diagnosis present

## 2023-12-11 HISTORY — PX: BREAST BIOPSY: SHX20

## 2023-12-11 HISTORY — PX: US LT BREAST BX W LOC DEV 1ST LESION IMG BX SPEC US GUIDE: IMG5431

## 2023-12-11 MED ORDER — LIDOCAINE 1 % OPTIME INJ - NO CHARGE
2.0000 mL | Freq: Once | INTRAMUSCULAR | Status: AC
  Administered 2023-12-11: 2 mL
  Filled 2023-12-11: qty 2

## 2023-12-11 MED ORDER — LIDOCAINE-EPINEPHRINE 1 %-1:100000 IJ SOLN
10.0000 mL | Freq: Once | INTRAMUSCULAR | Status: AC
  Administered 2023-12-11: 10 mL
  Filled 2023-12-11: qty 10

## 2023-12-13 LAB — SURGICAL PATHOLOGY
